# Patient Record
Sex: Female | Born: 1955
Health system: Southern US, Community
[De-identification: ages and names within clinical notes are randomized; demographics above are authoritative.]

## PROBLEM LIST (undated history)

## (undated) DIAGNOSIS — M7731 Calcaneal spur, right foot: Secondary | ICD-10-CM

## (undated) DIAGNOSIS — D649 Anemia, unspecified: Secondary | ICD-10-CM

## (undated) DIAGNOSIS — I10 Essential (primary) hypertension: Secondary | ICD-10-CM

## (undated) DIAGNOSIS — Z860100 Personal history of colon polyps, unspecified: Secondary | ICD-10-CM

## (undated) DIAGNOSIS — M1990 Unspecified osteoarthritis, unspecified site: Secondary | ICD-10-CM

## (undated) DIAGNOSIS — E079 Disorder of thyroid, unspecified: Secondary | ICD-10-CM

## (undated) DIAGNOSIS — R531 Weakness: Secondary | ICD-10-CM

## (undated) DIAGNOSIS — I639 Cerebral infarction, unspecified: Secondary | ICD-10-CM

## (undated) DIAGNOSIS — E785 Hyperlipidemia, unspecified: Secondary | ICD-10-CM

## (undated) DIAGNOSIS — M7732 Calcaneal spur, left foot: Secondary | ICD-10-CM

## (undated) HISTORY — DX: Weakness: R53.1

## (undated) HISTORY — DX: Essential (primary) hypertension: I10

## (undated) HISTORY — DX: Personal history of colonic polyps: Z86.010

## (undated) HISTORY — PX: COLONOSCOPY: SHX174

## (undated) HISTORY — DX: Hyperlipidemia, unspecified: E78.5

## (undated) HISTORY — DX: Disorder of thyroid, unspecified: E07.9

## (undated) HISTORY — DX: Personal history of colon polyps, unspecified: Z86.0100

## (undated) HISTORY — DX: Cerebral infarction, unspecified: I63.9

## (undated) HISTORY — DX: Anemia, unspecified: D64.9

## (undated) HISTORY — DX: Unspecified osteoarthritis, unspecified site: M19.90

## (undated) HISTORY — DX: Calcaneal spur, left foot: M77.31

## (undated) HISTORY — DX: Calcaneal spur, left foot: M77.32

---

## 1985-01-19 HISTORY — PX: HAND SURGERY: SHX662

## 2000-01-21 ENCOUNTER — Encounter: Payer: Self-pay | Admitting: Emergency Medicine

## 2000-01-21 ENCOUNTER — Emergency Department (HOSPITAL_COMMUNITY): Admission: EM | Admit: 2000-01-21 | Discharge: 2000-01-21 | Payer: Self-pay | Admitting: Emergency Medicine

## 2014-01-19 DIAGNOSIS — I639 Cerebral infarction, unspecified: Secondary | ICD-10-CM

## 2014-01-19 HISTORY — DX: Cerebral infarction, unspecified: I63.9

## 2017-03-19 DIAGNOSIS — G5692 Unspecified mononeuropathy of left upper limb: Secondary | ICD-10-CM | POA: Diagnosis not present

## 2017-03-19 DIAGNOSIS — I10 Essential (primary) hypertension: Secondary | ICD-10-CM | POA: Diagnosis not present

## 2017-03-20 DIAGNOSIS — G5692 Unspecified mononeuropathy of left upper limb: Secondary | ICD-10-CM | POA: Diagnosis not present

## 2017-03-20 DIAGNOSIS — I10 Essential (primary) hypertension: Secondary | ICD-10-CM | POA: Diagnosis not present

## 2017-03-21 DIAGNOSIS — G5692 Unspecified mononeuropathy of left upper limb: Secondary | ICD-10-CM | POA: Diagnosis not present

## 2017-03-21 DIAGNOSIS — I10 Essential (primary) hypertension: Secondary | ICD-10-CM | POA: Diagnosis not present

## 2017-03-22 DIAGNOSIS — I10 Essential (primary) hypertension: Secondary | ICD-10-CM | POA: Diagnosis not present

## 2017-03-22 DIAGNOSIS — G5692 Unspecified mononeuropathy of left upper limb: Secondary | ICD-10-CM | POA: Diagnosis not present

## 2017-03-23 DIAGNOSIS — I10 Essential (primary) hypertension: Secondary | ICD-10-CM | POA: Diagnosis not present

## 2017-03-23 DIAGNOSIS — G5692 Unspecified mononeuropathy of left upper limb: Secondary | ICD-10-CM | POA: Diagnosis not present

## 2017-03-24 DIAGNOSIS — I10 Essential (primary) hypertension: Secondary | ICD-10-CM | POA: Diagnosis not present

## 2017-03-24 DIAGNOSIS — G5692 Unspecified mononeuropathy of left upper limb: Secondary | ICD-10-CM | POA: Diagnosis not present

## 2017-03-25 DIAGNOSIS — I10 Essential (primary) hypertension: Secondary | ICD-10-CM | POA: Diagnosis not present

## 2017-03-25 DIAGNOSIS — G5692 Unspecified mononeuropathy of left upper limb: Secondary | ICD-10-CM | POA: Diagnosis not present

## 2017-03-26 DIAGNOSIS — G5692 Unspecified mononeuropathy of left upper limb: Secondary | ICD-10-CM | POA: Diagnosis not present

## 2017-03-26 DIAGNOSIS — I10 Essential (primary) hypertension: Secondary | ICD-10-CM | POA: Diagnosis not present

## 2017-03-27 DIAGNOSIS — I10 Essential (primary) hypertension: Secondary | ICD-10-CM | POA: Diagnosis not present

## 2017-03-27 DIAGNOSIS — G5692 Unspecified mononeuropathy of left upper limb: Secondary | ICD-10-CM | POA: Diagnosis not present

## 2017-03-28 DIAGNOSIS — G5692 Unspecified mononeuropathy of left upper limb: Secondary | ICD-10-CM | POA: Diagnosis not present

## 2017-03-28 DIAGNOSIS — I10 Essential (primary) hypertension: Secondary | ICD-10-CM | POA: Diagnosis not present

## 2017-03-29 DIAGNOSIS — I10 Essential (primary) hypertension: Secondary | ICD-10-CM | POA: Diagnosis not present

## 2017-03-29 DIAGNOSIS — G5692 Unspecified mononeuropathy of left upper limb: Secondary | ICD-10-CM | POA: Diagnosis not present

## 2017-03-30 DIAGNOSIS — I10 Essential (primary) hypertension: Secondary | ICD-10-CM | POA: Diagnosis not present

## 2017-03-30 DIAGNOSIS — G5692 Unspecified mononeuropathy of left upper limb: Secondary | ICD-10-CM | POA: Diagnosis not present

## 2017-03-31 DIAGNOSIS — G5692 Unspecified mononeuropathy of left upper limb: Secondary | ICD-10-CM | POA: Diagnosis not present

## 2017-03-31 DIAGNOSIS — I10 Essential (primary) hypertension: Secondary | ICD-10-CM | POA: Diagnosis not present

## 2017-04-01 DIAGNOSIS — G5692 Unspecified mononeuropathy of left upper limb: Secondary | ICD-10-CM | POA: Diagnosis not present

## 2017-04-01 DIAGNOSIS — I10 Essential (primary) hypertension: Secondary | ICD-10-CM | POA: Diagnosis not present

## 2017-04-02 DIAGNOSIS — I10 Essential (primary) hypertension: Secondary | ICD-10-CM | POA: Diagnosis not present

## 2017-04-02 DIAGNOSIS — G5692 Unspecified mononeuropathy of left upper limb: Secondary | ICD-10-CM | POA: Diagnosis not present

## 2017-04-03 DIAGNOSIS — G5692 Unspecified mononeuropathy of left upper limb: Secondary | ICD-10-CM | POA: Diagnosis not present

## 2017-04-03 DIAGNOSIS — I10 Essential (primary) hypertension: Secondary | ICD-10-CM | POA: Diagnosis not present

## 2017-04-04 DIAGNOSIS — G5692 Unspecified mononeuropathy of left upper limb: Secondary | ICD-10-CM | POA: Diagnosis not present

## 2017-04-04 DIAGNOSIS — I10 Essential (primary) hypertension: Secondary | ICD-10-CM | POA: Diagnosis not present

## 2017-04-05 DIAGNOSIS — G5692 Unspecified mononeuropathy of left upper limb: Secondary | ICD-10-CM | POA: Diagnosis not present

## 2017-04-05 DIAGNOSIS — I10 Essential (primary) hypertension: Secondary | ICD-10-CM | POA: Diagnosis not present

## 2017-04-06 DIAGNOSIS — I10 Essential (primary) hypertension: Secondary | ICD-10-CM | POA: Diagnosis not present

## 2017-04-06 DIAGNOSIS — G5692 Unspecified mononeuropathy of left upper limb: Secondary | ICD-10-CM | POA: Diagnosis not present

## 2017-04-07 DIAGNOSIS — G5692 Unspecified mononeuropathy of left upper limb: Secondary | ICD-10-CM | POA: Diagnosis not present

## 2017-04-07 DIAGNOSIS — I10 Essential (primary) hypertension: Secondary | ICD-10-CM | POA: Diagnosis not present

## 2017-04-08 DIAGNOSIS — I10 Essential (primary) hypertension: Secondary | ICD-10-CM | POA: Diagnosis not present

## 2017-04-08 DIAGNOSIS — G5692 Unspecified mononeuropathy of left upper limb: Secondary | ICD-10-CM | POA: Diagnosis not present

## 2017-04-09 DIAGNOSIS — G5692 Unspecified mononeuropathy of left upper limb: Secondary | ICD-10-CM | POA: Diagnosis not present

## 2017-04-09 DIAGNOSIS — I10 Essential (primary) hypertension: Secondary | ICD-10-CM | POA: Diagnosis not present

## 2017-04-10 DIAGNOSIS — I10 Essential (primary) hypertension: Secondary | ICD-10-CM | POA: Diagnosis not present

## 2017-04-10 DIAGNOSIS — G5692 Unspecified mononeuropathy of left upper limb: Secondary | ICD-10-CM | POA: Diagnosis not present

## 2017-04-11 DIAGNOSIS — I10 Essential (primary) hypertension: Secondary | ICD-10-CM | POA: Diagnosis not present

## 2017-04-11 DIAGNOSIS — G5692 Unspecified mononeuropathy of left upper limb: Secondary | ICD-10-CM | POA: Diagnosis not present

## 2017-04-12 DIAGNOSIS — G5692 Unspecified mononeuropathy of left upper limb: Secondary | ICD-10-CM | POA: Diagnosis not present

## 2017-04-12 DIAGNOSIS — I10 Essential (primary) hypertension: Secondary | ICD-10-CM | POA: Diagnosis not present

## 2017-04-13 DIAGNOSIS — G5692 Unspecified mononeuropathy of left upper limb: Secondary | ICD-10-CM | POA: Diagnosis not present

## 2017-04-13 DIAGNOSIS — I10 Essential (primary) hypertension: Secondary | ICD-10-CM | POA: Diagnosis not present

## 2017-04-14 DIAGNOSIS — I10 Essential (primary) hypertension: Secondary | ICD-10-CM | POA: Diagnosis not present

## 2017-04-14 DIAGNOSIS — G5692 Unspecified mononeuropathy of left upper limb: Secondary | ICD-10-CM | POA: Diagnosis not present

## 2017-04-15 DIAGNOSIS — I10 Essential (primary) hypertension: Secondary | ICD-10-CM | POA: Diagnosis not present

## 2017-04-15 DIAGNOSIS — G5692 Unspecified mononeuropathy of left upper limb: Secondary | ICD-10-CM | POA: Diagnosis not present

## 2017-04-16 DIAGNOSIS — G5692 Unspecified mononeuropathy of left upper limb: Secondary | ICD-10-CM | POA: Diagnosis not present

## 2017-04-16 DIAGNOSIS — I10 Essential (primary) hypertension: Secondary | ICD-10-CM | POA: Diagnosis not present

## 2017-04-17 DIAGNOSIS — I10 Essential (primary) hypertension: Secondary | ICD-10-CM | POA: Diagnosis not present

## 2017-04-17 DIAGNOSIS — G5692 Unspecified mononeuropathy of left upper limb: Secondary | ICD-10-CM | POA: Diagnosis not present

## 2017-04-18 DIAGNOSIS — I10 Essential (primary) hypertension: Secondary | ICD-10-CM | POA: Diagnosis not present

## 2017-04-18 DIAGNOSIS — G5692 Unspecified mononeuropathy of left upper limb: Secondary | ICD-10-CM | POA: Diagnosis not present

## 2017-04-19 DIAGNOSIS — G5692 Unspecified mononeuropathy of left upper limb: Secondary | ICD-10-CM | POA: Diagnosis not present

## 2017-04-19 DIAGNOSIS — I10 Essential (primary) hypertension: Secondary | ICD-10-CM | POA: Diagnosis not present

## 2017-04-20 DIAGNOSIS — G5692 Unspecified mononeuropathy of left upper limb: Secondary | ICD-10-CM | POA: Diagnosis not present

## 2017-04-20 DIAGNOSIS — I10 Essential (primary) hypertension: Secondary | ICD-10-CM | POA: Diagnosis not present

## 2017-04-21 DIAGNOSIS — I10 Essential (primary) hypertension: Secondary | ICD-10-CM | POA: Diagnosis not present

## 2017-04-21 DIAGNOSIS — G5692 Unspecified mononeuropathy of left upper limb: Secondary | ICD-10-CM | POA: Diagnosis not present

## 2017-04-22 DIAGNOSIS — I10 Essential (primary) hypertension: Secondary | ICD-10-CM | POA: Diagnosis not present

## 2017-04-22 DIAGNOSIS — G5692 Unspecified mononeuropathy of left upper limb: Secondary | ICD-10-CM | POA: Diagnosis not present

## 2017-04-23 DIAGNOSIS — R531 Weakness: Secondary | ICD-10-CM | POA: Diagnosis not present

## 2017-04-24 DIAGNOSIS — R531 Weakness: Secondary | ICD-10-CM | POA: Diagnosis not present

## 2017-04-25 DIAGNOSIS — R531 Weakness: Secondary | ICD-10-CM | POA: Diagnosis not present

## 2017-04-26 DIAGNOSIS — R531 Weakness: Secondary | ICD-10-CM | POA: Diagnosis not present

## 2017-04-27 DIAGNOSIS — R531 Weakness: Secondary | ICD-10-CM | POA: Diagnosis not present

## 2017-04-28 DIAGNOSIS — R531 Weakness: Secondary | ICD-10-CM | POA: Diagnosis not present

## 2017-04-29 DIAGNOSIS — R531 Weakness: Secondary | ICD-10-CM | POA: Diagnosis not present

## 2017-04-30 DIAGNOSIS — R531 Weakness: Secondary | ICD-10-CM | POA: Diagnosis not present

## 2017-05-01 DIAGNOSIS — R531 Weakness: Secondary | ICD-10-CM | POA: Diagnosis not present

## 2017-05-02 DIAGNOSIS — R531 Weakness: Secondary | ICD-10-CM | POA: Diagnosis not present

## 2017-05-03 DIAGNOSIS — R531 Weakness: Secondary | ICD-10-CM | POA: Diagnosis not present

## 2017-05-04 DIAGNOSIS — R531 Weakness: Secondary | ICD-10-CM | POA: Diagnosis not present

## 2017-05-05 DIAGNOSIS — Z0000 Encounter for general adult medical examination without abnormal findings: Secondary | ICD-10-CM | POA: Diagnosis not present

## 2017-05-05 DIAGNOSIS — R531 Weakness: Secondary | ICD-10-CM | POA: Diagnosis not present

## 2017-05-05 DIAGNOSIS — Z01419 Encounter for gynecological examination (general) (routine) without abnormal findings: Secondary | ICD-10-CM | POA: Diagnosis not present

## 2017-05-06 DIAGNOSIS — R531 Weakness: Secondary | ICD-10-CM | POA: Diagnosis not present

## 2017-05-07 DIAGNOSIS — R531 Weakness: Secondary | ICD-10-CM | POA: Diagnosis not present

## 2017-05-08 DIAGNOSIS — R531 Weakness: Secondary | ICD-10-CM | POA: Diagnosis not present

## 2017-05-09 DIAGNOSIS — R531 Weakness: Secondary | ICD-10-CM | POA: Diagnosis not present

## 2017-05-10 DIAGNOSIS — R531 Weakness: Secondary | ICD-10-CM | POA: Diagnosis not present

## 2017-05-11 DIAGNOSIS — R531 Weakness: Secondary | ICD-10-CM | POA: Diagnosis not present

## 2017-05-12 DIAGNOSIS — R531 Weakness: Secondary | ICD-10-CM | POA: Diagnosis not present

## 2017-05-13 DIAGNOSIS — R531 Weakness: Secondary | ICD-10-CM | POA: Diagnosis not present

## 2017-05-14 DIAGNOSIS — R531 Weakness: Secondary | ICD-10-CM | POA: Diagnosis not present

## 2017-05-15 DIAGNOSIS — R531 Weakness: Secondary | ICD-10-CM | POA: Diagnosis not present

## 2017-05-16 DIAGNOSIS — R531 Weakness: Secondary | ICD-10-CM | POA: Diagnosis not present

## 2017-05-17 DIAGNOSIS — R531 Weakness: Secondary | ICD-10-CM | POA: Diagnosis not present

## 2017-05-18 DIAGNOSIS — R531 Weakness: Secondary | ICD-10-CM | POA: Diagnosis not present

## 2017-05-19 DIAGNOSIS — R531 Weakness: Secondary | ICD-10-CM | POA: Diagnosis not present

## 2017-05-20 DIAGNOSIS — R531 Weakness: Secondary | ICD-10-CM | POA: Diagnosis not present

## 2017-05-21 DIAGNOSIS — R531 Weakness: Secondary | ICD-10-CM | POA: Diagnosis not present

## 2017-05-22 DIAGNOSIS — R531 Weakness: Secondary | ICD-10-CM | POA: Diagnosis not present

## 2017-05-23 DIAGNOSIS — R531 Weakness: Secondary | ICD-10-CM | POA: Diagnosis not present

## 2017-05-24 DIAGNOSIS — R531 Weakness: Secondary | ICD-10-CM | POA: Diagnosis not present

## 2017-05-25 DIAGNOSIS — R531 Weakness: Secondary | ICD-10-CM | POA: Diagnosis not present

## 2017-05-26 DIAGNOSIS — R531 Weakness: Secondary | ICD-10-CM | POA: Diagnosis not present

## 2017-05-27 DIAGNOSIS — R531 Weakness: Secondary | ICD-10-CM | POA: Diagnosis not present

## 2017-05-28 DIAGNOSIS — R531 Weakness: Secondary | ICD-10-CM | POA: Diagnosis not present

## 2017-05-29 DIAGNOSIS — R531 Weakness: Secondary | ICD-10-CM | POA: Diagnosis not present

## 2017-05-30 DIAGNOSIS — R531 Weakness: Secondary | ICD-10-CM | POA: Diagnosis not present

## 2017-05-31 DIAGNOSIS — R531 Weakness: Secondary | ICD-10-CM | POA: Diagnosis not present

## 2017-06-01 DIAGNOSIS — R531 Weakness: Secondary | ICD-10-CM | POA: Diagnosis not present

## 2017-06-01 DIAGNOSIS — Z1231 Encounter for screening mammogram for malignant neoplasm of breast: Secondary | ICD-10-CM | POA: Diagnosis not present

## 2017-06-02 DIAGNOSIS — R531 Weakness: Secondary | ICD-10-CM | POA: Diagnosis not present

## 2017-06-03 DIAGNOSIS — R531 Weakness: Secondary | ICD-10-CM | POA: Diagnosis not present

## 2017-06-04 DIAGNOSIS — R531 Weakness: Secondary | ICD-10-CM | POA: Diagnosis not present

## 2017-06-05 DIAGNOSIS — R531 Weakness: Secondary | ICD-10-CM | POA: Diagnosis not present

## 2017-06-06 DIAGNOSIS — R531 Weakness: Secondary | ICD-10-CM | POA: Diagnosis not present

## 2017-06-07 DIAGNOSIS — R531 Weakness: Secondary | ICD-10-CM | POA: Diagnosis not present

## 2017-06-08 DIAGNOSIS — R531 Weakness: Secondary | ICD-10-CM | POA: Diagnosis not present

## 2017-06-09 DIAGNOSIS — R531 Weakness: Secondary | ICD-10-CM | POA: Diagnosis not present

## 2017-06-10 DIAGNOSIS — R531 Weakness: Secondary | ICD-10-CM | POA: Diagnosis not present

## 2017-06-11 DIAGNOSIS — R531 Weakness: Secondary | ICD-10-CM | POA: Diagnosis not present

## 2017-06-12 DIAGNOSIS — R531 Weakness: Secondary | ICD-10-CM | POA: Diagnosis not present

## 2017-06-13 DIAGNOSIS — R531 Weakness: Secondary | ICD-10-CM | POA: Diagnosis not present

## 2017-06-14 DIAGNOSIS — R531 Weakness: Secondary | ICD-10-CM | POA: Diagnosis not present

## 2017-06-15 DIAGNOSIS — R531 Weakness: Secondary | ICD-10-CM | POA: Diagnosis not present

## 2017-06-16 DIAGNOSIS — R531 Weakness: Secondary | ICD-10-CM | POA: Diagnosis not present

## 2017-06-17 DIAGNOSIS — R531 Weakness: Secondary | ICD-10-CM | POA: Diagnosis not present

## 2017-06-18 DIAGNOSIS — R531 Weakness: Secondary | ICD-10-CM | POA: Diagnosis not present

## 2017-06-19 DIAGNOSIS — R531 Weakness: Secondary | ICD-10-CM | POA: Diagnosis not present

## 2017-06-20 DIAGNOSIS — R531 Weakness: Secondary | ICD-10-CM | POA: Diagnosis not present

## 2017-06-21 DIAGNOSIS — R531 Weakness: Secondary | ICD-10-CM | POA: Diagnosis not present

## 2017-06-22 DIAGNOSIS — R531 Weakness: Secondary | ICD-10-CM | POA: Diagnosis not present

## 2017-06-23 DIAGNOSIS — R531 Weakness: Secondary | ICD-10-CM | POA: Diagnosis not present

## 2017-06-24 DIAGNOSIS — R531 Weakness: Secondary | ICD-10-CM | POA: Diagnosis not present

## 2017-06-25 DIAGNOSIS — R531 Weakness: Secondary | ICD-10-CM | POA: Diagnosis not present

## 2017-06-26 DIAGNOSIS — R531 Weakness: Secondary | ICD-10-CM | POA: Diagnosis not present

## 2017-06-27 DIAGNOSIS — R531 Weakness: Secondary | ICD-10-CM | POA: Diagnosis not present

## 2017-06-28 DIAGNOSIS — R531 Weakness: Secondary | ICD-10-CM | POA: Diagnosis not present

## 2017-06-29 DIAGNOSIS — R531 Weakness: Secondary | ICD-10-CM | POA: Diagnosis not present

## 2017-06-30 DIAGNOSIS — R531 Weakness: Secondary | ICD-10-CM | POA: Diagnosis not present

## 2017-07-01 DIAGNOSIS — R531 Weakness: Secondary | ICD-10-CM | POA: Diagnosis not present

## 2017-07-02 DIAGNOSIS — R531 Weakness: Secondary | ICD-10-CM | POA: Diagnosis not present

## 2017-07-03 DIAGNOSIS — R531 Weakness: Secondary | ICD-10-CM | POA: Diagnosis not present

## 2017-07-04 DIAGNOSIS — R531 Weakness: Secondary | ICD-10-CM | POA: Diagnosis not present

## 2017-07-05 DIAGNOSIS — R531 Weakness: Secondary | ICD-10-CM | POA: Diagnosis not present

## 2017-07-06 DIAGNOSIS — R531 Weakness: Secondary | ICD-10-CM | POA: Diagnosis not present

## 2017-07-07 DIAGNOSIS — R531 Weakness: Secondary | ICD-10-CM | POA: Diagnosis not present

## 2017-07-08 DIAGNOSIS — R531 Weakness: Secondary | ICD-10-CM | POA: Diagnosis not present

## 2017-07-09 DIAGNOSIS — R531 Weakness: Secondary | ICD-10-CM | POA: Diagnosis not present

## 2017-07-10 DIAGNOSIS — R531 Weakness: Secondary | ICD-10-CM | POA: Diagnosis not present

## 2017-07-11 DIAGNOSIS — R531 Weakness: Secondary | ICD-10-CM | POA: Diagnosis not present

## 2017-07-12 DIAGNOSIS — R531 Weakness: Secondary | ICD-10-CM | POA: Diagnosis not present

## 2017-07-13 DIAGNOSIS — R531 Weakness: Secondary | ICD-10-CM | POA: Diagnosis not present

## 2017-07-14 DIAGNOSIS — R531 Weakness: Secondary | ICD-10-CM | POA: Diagnosis not present

## 2017-07-15 DIAGNOSIS — R531 Weakness: Secondary | ICD-10-CM | POA: Diagnosis not present

## 2017-07-16 DIAGNOSIS — R531 Weakness: Secondary | ICD-10-CM | POA: Diagnosis not present

## 2017-07-17 DIAGNOSIS — R531 Weakness: Secondary | ICD-10-CM | POA: Diagnosis not present

## 2017-07-18 DIAGNOSIS — R531 Weakness: Secondary | ICD-10-CM | POA: Diagnosis not present

## 2017-08-11 ENCOUNTER — Other Ambulatory Visit (INDEPENDENT_AMBULATORY_CARE_PROVIDER_SITE_OTHER): Payer: Medicare HMO

## 2017-08-11 ENCOUNTER — Ambulatory Visit: Payer: Self-pay | Admitting: Family

## 2017-08-11 ENCOUNTER — Encounter: Payer: Self-pay | Admitting: Gastroenterology

## 2017-08-11 ENCOUNTER — Ambulatory Visit (INDEPENDENT_AMBULATORY_CARE_PROVIDER_SITE_OTHER): Payer: Medicare HMO | Admitting: Family

## 2017-08-11 ENCOUNTER — Encounter: Payer: Self-pay | Admitting: Family

## 2017-08-11 VITALS — BP 122/82 | HR 64 | Temp 98.0°F | Ht 67.0 in | Wt 159.1 lb

## 2017-08-11 DIAGNOSIS — F32A Depression, unspecified: Secondary | ICD-10-CM

## 2017-08-11 DIAGNOSIS — Z1211 Encounter for screening for malignant neoplasm of colon: Secondary | ICD-10-CM | POA: Diagnosis not present

## 2017-08-11 DIAGNOSIS — M1990 Unspecified osteoarthritis, unspecified site: Secondary | ICD-10-CM | POA: Insufficient documentation

## 2017-08-11 DIAGNOSIS — Z720 Tobacco use: Secondary | ICD-10-CM | POA: Diagnosis not present

## 2017-08-11 DIAGNOSIS — Z1159 Encounter for screening for other viral diseases: Secondary | ICD-10-CM

## 2017-08-11 DIAGNOSIS — E039 Hypothyroidism, unspecified: Secondary | ICD-10-CM

## 2017-08-11 DIAGNOSIS — I639 Cerebral infarction, unspecified: Secondary | ICD-10-CM

## 2017-08-11 DIAGNOSIS — E559 Vitamin D deficiency, unspecified: Secondary | ICD-10-CM

## 2017-08-11 DIAGNOSIS — F329 Major depressive disorder, single episode, unspecified: Secondary | ICD-10-CM | POA: Insufficient documentation

## 2017-08-11 DIAGNOSIS — R7303 Prediabetes: Secondary | ICD-10-CM | POA: Diagnosis not present

## 2017-08-11 DIAGNOSIS — M79672 Pain in left foot: Secondary | ICD-10-CM

## 2017-08-11 DIAGNOSIS — M79671 Pain in right foot: Secondary | ICD-10-CM

## 2017-08-11 DIAGNOSIS — I10 Essential (primary) hypertension: Secondary | ICD-10-CM | POA: Diagnosis not present

## 2017-08-11 DIAGNOSIS — B019 Varicella without complication: Secondary | ICD-10-CM | POA: Insufficient documentation

## 2017-08-11 LAB — CBC WITH DIFFERENTIAL/PLATELET
Basophils Absolute: 0.1 10*3/uL (ref 0.0–0.1)
Basophils Relative: 1.2 % (ref 0.0–3.0)
Eosinophils Absolute: 0.1 10*3/uL (ref 0.0–0.7)
Eosinophils Relative: 0.8 % (ref 0.0–5.0)
HCT: 40.8 % (ref 36.0–46.0)
Hemoglobin: 13.6 g/dL (ref 12.0–15.0)
LYMPHS ABS: 3.4 10*3/uL (ref 0.7–4.0)
Lymphocytes Relative: 54 % — ABNORMAL HIGH (ref 12.0–46.0)
MCHC: 33.4 g/dL (ref 30.0–36.0)
MCV: 87.3 fl (ref 78.0–100.0)
Monocytes Absolute: 0.5 10*3/uL (ref 0.1–1.0)
Monocytes Relative: 7.6 % (ref 3.0–12.0)
Neutro Abs: 2.3 10*3/uL (ref 1.4–7.7)
Neutrophils Relative %: 36.4 % — ABNORMAL LOW (ref 43.0–77.0)
Platelets: 282 10*3/uL (ref 150.0–400.0)
RBC: 4.67 Mil/uL (ref 3.87–5.11)
RDW: 15.3 % (ref 11.5–15.5)
WBC: 6.4 10*3/uL (ref 4.0–10.5)

## 2017-08-11 LAB — COMPREHENSIVE METABOLIC PANEL
ALK PHOS: 100 U/L (ref 39–117)
ALT: 23 U/L (ref 0–35)
AST: 24 U/L (ref 0–37)
Albumin: 4.2 g/dL (ref 3.5–5.2)
BUN: 11 mg/dL (ref 6–23)
CO2: 27 mEq/L (ref 19–32)
Calcium: 9.2 mg/dL (ref 8.4–10.5)
Chloride: 100 mEq/L (ref 96–112)
Creatinine, Ser: 0.82 mg/dL (ref 0.40–1.20)
GFR: 91.42 mL/min (ref >60.00)
Glucose, Bld: 94 mg/dL (ref 70–99)
Potassium: 4.5 mEq/L (ref 3.5–5.1)
SODIUM: 135 meq/L (ref 135–145)
TOTAL PROTEIN: 7.7 g/dL (ref 6.0–8.3)
Total Bilirubin: 0.3 mg/dL (ref 0.2–1.2)

## 2017-08-11 LAB — LIPID PANEL
CHOL/HDL RATIO: 3
Cholesterol: 163 mg/dL (ref 0–200)
HDL: 55.5 mg/dL (ref >39.00)
LDL Cholesterol: 81 mg/dL (ref 0–99)
NonHDL: 107.83
Triglycerides: 136 mg/dL (ref 0.0–149.0)
VLDL: 27.2 mg/dL (ref 0.0–40.0)

## 2017-08-11 LAB — TSH: TSH: 2.05 u[IU]/mL (ref 0.35–4.50)

## 2017-08-11 LAB — HEMOGLOBIN A1C: HEMOGLOBIN A1C: 6.4 % (ref 4.6–6.5)

## 2017-08-11 LAB — VITAMIN D 25 HYDROXY (VIT D DEFICIENCY, FRACTURES): VITD: 29.8 ng/mL — ABNORMAL LOW (ref 30.00–100.00)

## 2017-08-11 MED ORDER — METOPROLOL TARTRATE 25 MG PO TABS
25.0000 mg | ORAL_TABLET | Freq: Two times a day (BID) | ORAL | 1 refills | Status: DC
Start: 2017-08-11 — End: 2020-02-19

## 2017-08-11 MED ORDER — GABAPENTIN 600 MG PO TABS: 600.0000 mg | ORAL_TABLET | Freq: Three times a day (TID) | ORAL | 0 refills | Status: DC

## 2017-08-11 NOTE — Progress Notes (Signed)
Jordan Bass is a 62 y.o. female with the following history as recorded in Epic[facility]:  Patient Active Problem List   Diagnosis Date Noted  . Arthritis 08/11/2017  . Depression 08/11/2017  . High blood pressure 08/11/2017  . Stroke (cerebrum) ([place]) 08/11/2017  . Chicken pox 08/11/2017    Current Outpatient Medications  Medication Sig Dispense Refill  . amLODipine (NORVASC) 5 MG tablet     . aspirin EC 81 MG tablet Take 81 mg by mouth daily.    . Cholecalciferol (VITAMIN D3) 1000 units CAPS Take by mouth.    . famotidine (PEPCID) 20 MG tablet     . levothyroxine (SYNTHROID, LEVOTHROID) 25 MCG tablet     . lisinopril (PRINIVIL,ZESTRIL) 10 MG tablet     . metoprolol tartrate (LOPRESSOR) 25 MG tablet Take 1 tablet (25 mg total) by mouth 2 (two) times daily. 180 tablet 1  . rosuvastatin (CRESTOR) 5 MG tablet     . spironolactone (ALDACTONE) 25 MG tablet     . gabapentin (NEURONTIN) 600 MG tablet Take 1 tablet (600 mg total) by mouth 3 (three) times daily. 270 tablet 0   No current facility-administered medications for this visit.     Allergies: Patient has no allergy information on record.  History reviewed. No pertinent past medical history.  History reviewed. No pertinent surgical history.  History reviewed. No pertinent family history.  Social History   Tobacco Use  . Smoking status: Not on file  Substance Use Topics  . Alcohol use: Not on file    Subjective:  Patient presents to establish care; has moved back to [place] in the past 6 months; was living in [place], [place]; history of hypertension, hypothyroidism; had CVA in 2016; + smoker- knows needs to quit but not ready at this time; Denies any chest pain, shortness of breath, blurred vision or headache.  Is up to date on mammogram/ pap smear- will get records transferred; Notes that vaccines up to date as well- will get records transferred;   Objective:  Vitals:   08/11/17 0939 08/11/17 0941  BP: 122/82   Pulse: 64    Temp: 98 F (36.7 C)   TempSrc: Oral   SpO2: 99%   Weight: 159 lb 1.9 oz (72.2 kg)   Height:  5' 7"  (1.702 m)    General: Well developed, well nourished, in no acute distress  Skin : Warm and dry.  Head: Normocephalic and atraumatic  Eyes: Sclera and conjunctiva clear; pupils round and reactive to light; extraocular movements intact  Ears: External normal; canals clear; tympanic membranes normal  Oropharynx: Pink, supple. No suspicious lesions  Neck: Supple without thyromegaly, adenopathy  Lungs: Respirations unlabored; clear to auscultation bilaterally without wheeze, rales, rhonchi  CVS exam: normal rate and regular rhythm.  Musculoskeletal: No deformities; no active joint inflammation  Extremities: No edema, cyanosis, clubbing  Vessels: Symmetric bilaterally  Neurologic: Alert and oriented; speech intact; face symmetrical; uses cane for stability; CNII-XII intact without focal deficit  Assessment:  1. Hypertension, unspecified type   2. Arthritis   3. Depression, unspecified depression type   4. Cerebrovascular accident (CVA), unspecified mechanism ([place])   5. Encounter for screening colonoscopy   6. Pre-diabetes   7. Hypothyroidism, unspecified type   8. Vitamin D deficiency   9. Tobacco abuse   10. Pain in both feet   11. Need for hepatitis C screening test     Plan:  Chronic conditions are stable; refills are up to date- extensive medication review  done with patient today; will update labs for chronic needs- she thinks labs were done by her previous provider about 6 months ago; refer for colonoscopy/ lung cancer CT screen; encouraged to quit smoking; records request has been sent; will update vaccines as needed after records received; refer to podiatry per patient request; follow-up to be determined.    No follow-ups on file.  Orders Placed This Encounter  Procedures  . CBC w/Diff    Standing Status:   Future    Number of Occurrences:   1    Standing Expiration Date:    08/11/2018  . Comp Met (CMET)    Standing Status:   Future    Number of Occurrences:   1    Standing Expiration Date:   08/11/2018  . Lipid panel    Standing Status:   Future    Number of Occurrences:   1    Standing Expiration Date:   08/12/2018  . HgB A1c    Standing Status:   Future    Number of Occurrences:   1    Standing Expiration Date:   08/11/2018  . TSH    Standing Status:   Future    Number of Occurrences:   1    Standing Expiration Date:   08/11/2018  . Vitamin D (25 hydroxy)    Standing Status:   Future    Number of Occurrences:   1    Standing Expiration Date:   08/11/2018  . Hepatitis C Antibody    Standing Status:   Future    Number of Occurrences:   1    Standing Expiration Date:   08/11/2018  . Ambulatory referral to Gastroenterology    Referral Priority:   Routine    Referral Type:   Consultation    Referral Reason:   Specialty Services Required    Number of Visits Requested:   1  . Ambulatory Referral for Lung Cancer Scre    Referral Priority:   Routine    Referral Type:   Consultation    Referral Reason:   Specialty Services Required    Number of Visits Requested:   1  . Ambulatory referral to Podiatry    Referral Priority:   Routine    Referral Type:   Consultation    Referral Reason:   Specialty Services Required    Requested Specialty:   Podiatry    Number of Visits Requested:   1    Requested Prescriptions   Signed Prescriptions Disp Refills  . metoprolol tartrate (LOPRESSOR) 25 MG tablet 180 tablet 1    Sig: Take 1 tablet (25 mg total) by mouth 2 (two) times daily.  Marland Kitchen gabapentin (NEURONTIN) 600 MG tablet 270 tablet 0    Sig: Take 1 tablet (600 mg total) by mouth 3 (three) times daily.

## 2017-08-12 LAB — HEPATITIS C ANTIBODY
HEP C AB: NONREACTIVE
SIGNAL TO CUT-OFF: 0.02 (ref <1.00)

## 2017-08-13 ENCOUNTER — Telehealth: Payer: Self-pay

## 2017-08-13 NOTE — Telephone Encounter (Signed)
Send request for pap and mammogram info. Patient filled out release for [place] to obtain records.

## 2017-08-13 NOTE — Telephone Encounter (Signed)
Faxed request for medical records to [facility] [phone] 08/13/17  LM

## 2017-08-17 NOTE — Telephone Encounter (Signed)
Received Medical records from [facility] - sending via interoffice mail to [employer]  08/17/17  LM

## 2017-08-17 NOTE — Telephone Encounter (Signed)
Request for medical records has already been sent for [place] to receive info.

## 2017-08-17 NOTE — Telephone Encounter (Signed)
Jordan Bass       08/13/17 4:48 PM  Note    Faxed request for medical records to [facility] [phone] 08/13/17  LM

## 2017-08-24 ENCOUNTER — Ambulatory Visit: Payer: Medicare HMO | Admitting: Sports Medicine

## 2017-09-13 DIAGNOSIS — R531 Weakness: Secondary | ICD-10-CM | POA: Diagnosis not present

## 2017-09-14 ENCOUNTER — Ambulatory Visit (INDEPENDENT_AMBULATORY_CARE_PROVIDER_SITE_OTHER): Payer: Medicare HMO | Admitting: Sports Medicine

## 2017-09-14 ENCOUNTER — Telehealth: Payer: Self-pay | Admitting: *Deleted

## 2017-09-14 ENCOUNTER — Ambulatory Visit (INDEPENDENT_AMBULATORY_CARE_PROVIDER_SITE_OTHER): Payer: Medicare HMO

## 2017-09-14 ENCOUNTER — Encounter: Payer: Self-pay | Admitting: Sports Medicine

## 2017-09-14 VITALS — BP 110/61 | HR 62

## 2017-09-14 DIAGNOSIS — R29898 Other symptoms and signs involving the musculoskeletal system: Secondary | ICD-10-CM

## 2017-09-14 DIAGNOSIS — M79672 Pain in left foot: Secondary | ICD-10-CM | POA: Diagnosis not present

## 2017-09-14 DIAGNOSIS — Z8673 Personal history of transient ischemic attack (TIA), and cerebral infarction without residual deficits: Secondary | ICD-10-CM

## 2017-09-14 DIAGNOSIS — M722 Plantar fascial fibromatosis: Secondary | ICD-10-CM

## 2017-09-14 DIAGNOSIS — M7662 Achilles tendinitis, left leg: Secondary | ICD-10-CM | POA: Diagnosis not present

## 2017-09-14 DIAGNOSIS — M7661 Achilles tendinitis, right leg: Secondary | ICD-10-CM | POA: Diagnosis not present

## 2017-09-14 DIAGNOSIS — M79671 Pain in right foot: Secondary | ICD-10-CM | POA: Diagnosis not present

## 2017-09-14 DIAGNOSIS — R531 Weakness: Secondary | ICD-10-CM | POA: Diagnosis not present

## 2017-09-14 MED ORDER — METHYLPREDNISOLONE 4 MG PO TBPK: ORAL_TABLET | ORAL | 0 refills | Status: DC

## 2017-09-14 NOTE — Telephone Encounter (Signed)
-----   Message from Landis Martins, [place] sent at 09/14/2017 10:46 AM EDT ----- Regarding: PT [facility] in office  Tendonitis and fasciitis history of stroke

## 2017-09-14 NOTE — Progress Notes (Signed)
Subjective: Jordan Bass is a 62 y.o. female patient who presents to office for evaluation of Left greater than right heel pain. Patient complains of progressive pain especially over the last 20 years.  Patient reports that she had a history of a stroke and left side is also her weak side and states that she has had issues with her heels bothering her ever since.  Patient has tried injections in the past with no relief in symptoms. Patient denies any other pedal complaints.   Review of Systems  Musculoskeletal: Positive for joint pain.  All other systems reviewed and are negative.    Patient Active Problem List   Diagnosis Date Noted  . Arthritis 08/11/2017  . Depression 08/11/2017  . High blood pressure 08/11/2017  . Stroke (cerebrum) ([place]) 08/11/2017  . Chicken pox 08/11/2017    Current Outpatient Medications on File Prior to Visit  Medication Sig Dispense Refill  . amLODipine (NORVASC) 5 MG tablet     . aspirin EC 81 MG tablet Take 81 mg by mouth daily.    . Cholecalciferol (VITAMIN D3) 1000 units CAPS Take by mouth.    . famotidine (PEPCID) 20 MG tablet     . gabapentin (NEURONTIN) 600 MG tablet Take 1 tablet (600 mg total) by mouth 3 (three) times daily. 270 tablet 0  . levothyroxine (SYNTHROID, LEVOTHROID) 25 MCG tablet     . lisinopril (PRINIVIL,ZESTRIL) 10 MG tablet     . metoprolol tartrate (LOPRESSOR) 25 MG tablet Take 1 tablet (25 mg total) by mouth 2 (two) times daily. 180 tablet 1  . rosuvastatin (CRESTOR) 5 MG tablet     . spironolactone (ALDACTONE) 25 MG tablet      No current facility-administered medications on file prior to visit.     Allergies  Allergen Reactions  . Seasonal Ic [Cholestatin]     Objective:  General: Alert and oriented x3 in no acute distress  Dermatology: No open lesions bilateral lower extremities, no webspace macerations, no ecchymosis bilateral, all nails x 10 are well manicured.  Vascular: Dorsalis Pedis and Posterior Tibial  pedal pulses 1/4, Capillary Fill Time 3 seconds, + pedal hair growth bilateral, no edema bilateral lower extremities, Temperature gradient within normal limits.  Neurology: Johney Maine sensation intact via light touch bilateral.  Musculoskeletal: Mild tenderness with palpation at insertion of the Achilles on left greater than right, there is calcaneal exostosis with mild soft tissue present and decreased ankle rom with knee extending  vs flexed resembling gastroc equnius bilateral, The achilles tendon feels intact with no nodularity or palpable dell, Thompson sign negative, Subtalar and midtarsal joint range of motion is within normal limits, there is no 1st ray hypermobility or forefoot deformity noted bilateral.  Mild pain to plantar fascial insertion bilateral.  Pes planus foot type.  Gait: Antalgic gait with cane.  Xrays  Right/Left Foot    Impression: Normal osseous mineralization. Joint spaces preserved except midtarsal joint supportive of pes planus. No fracture/dislocation/boney destruction. Calcaneal spur present. Kager's triangle intact with no obliteration. No soft tissue abnormalities or radiopaque foreign bodies.   Assessment and Plan: Problem List Items Addressed This Visit    None    Visit Diagnoses    Plantar fasciitis, bilateral    -  Primary   Relevant Orders   DG Foot Complete Right (Completed)   DG Foot Complete Left (Completed)   Achilles tendonitis, bilateral       Heel pain, bilateral       History of  embolic stroke       Left leg weakness          -Complete examination performed -Xrays reviewed -Discussed treatement options for Achilles tendinitis and plantar fasciitis -Rx Medrol to take as instructed and physical therapy -Patient to return to office 4 to 6 weeks follow-up physical therapy or sooner if condition worsens.  Landis Martins, DPM

## 2017-09-14 NOTE — Telephone Encounter (Signed)
Hand delivered required form to [facility] - In-office.

## 2017-09-14 NOTE — Patient Instructions (Signed)
Plantar Fasciitis Rehab Ask your health care provider which exercises are safe for you. Do exercises exactly as told by your health care provider and adjust them as directed. It is normal to feel mild stretching, pulling, tightness, or discomfort as you do these exercises, but you should stop right away if you feel sudden pain or your pain gets worse. Do not begin these exercises until told by your health care provider. Stretching and range of motion exercises These exercises warm up your muscles and joints and improve the movement and flexibility of your foot. These exercises also help to relieve pain. Exercise A: Plantar fascia stretch  1. Sit with your left / right leg crossed over your opposite knee. 2. Hold your heel with one hand with that thumb near your arch. With your other hand, hold your toes and gently pull them back toward the top of your foot. You should feel a stretch on the bottom of your toes or your foot or both. 3. Hold this stretch for__________ seconds. 4. Slowly release your toes and return to the starting position. Repeat __________ times. Complete this exercise __________ times a day. Exercise B: Gastroc, standing  1. Stand with your hands against a wall. 2. Extend your left / right leg behind you, and bend your front knee slightly. 3. Keeping your heels on the floor and keeping your back knee straight, shift your weight toward the wall without arching your back. You should feel a gentle stretch in your left / right calf. 4. Hold this position for __________ seconds. Repeat __________ times. Complete this exercise __________ times a day. Exercise C: Soleus, standing 1. Stand with your hands against a wall. 2. Extend your left / right leg behind you, and bend your front knee slightly. 3. Keeping your heels on the floor, bend your back knee and slightly shift your weight over the back leg. You should feel a gentle stretch deep in your calf. 4. Hold this position for  __________ seconds. Repeat __________ times. Complete this exercise __________ times a day. Exercise D: Gastrocsoleus, standing 1. Stand with the ball of your left / right foot on a step. The ball of your foot is on the walking surface, right under your toes. 2. Keep your other foot firmly on the same step. 3. Hold onto the wall or a railing for balance. 4. Slowly lift your other foot, allowing your body weight to press your heel down over the edge of the step. You should feel a stretch in your left / right calf. 5. Hold this position for __________ seconds. 6. Return both feet to the step. 7. Repeat this exercise with a slight bend in your left / right knee. Repeat __________ times with your left / right knee straight and __________ times with your left / right knee bent. Complete this exercise __________ times a day. Balance exercise This exercise builds your balance and strength control of your arch to help take pressure off your plantar fascia. Exercise E: Single leg stand 1. Without shoes, stand near a railing or in a doorway. You may hold onto the railing or door frame as needed. 2. Stand on your left / right foot. Keep your big toe down on the floor and try to keep your arch lifted. Do not let your foot roll inward. 3. Hold this position for __________ seconds. 4. If this exercise is too easy, you can try it with your eyes closed or while standing on a pillow. Repeat __________ times. Complete  this exercise __________ times a day. This information is not intended to replace advice given to you by your health care provider. Make sure you discuss any questions you have with your health care provider. Document Released: 01/05/2005 Document Revised: 09/10/2015 Document Reviewed: 11/19/2014 Elsevier Interactive Patient Education  2018 [employer].  Achilles Tendinitis Achilles tendinitis is inflammation of the tough, cord-like band that attaches the lower leg muscles to the heel bone  (Achilles tendon). This is usually caused by overusing the tendon and the ankle joint. Achilles tendinitis usually gets better over time with treatment and caring for yourself at home. It can take weeks or months to heal completely. What are the causes? This condition may be caused by:  A sudden increase in exercise or activity, such as running.  Doing the same exercises or activities (such as jumping) over and over.  Not warming up calf muscles before exercising.  Exercising in shoes that are worn out or not made for exercise.  Having arthritis or a bone growth (spur) on the back of the heel bone. This can rub against the tendon and hurt it.  Age-related wear and tear. Tendons become less flexible with age and more likely to be injured.  What are the signs or symptoms? Common symptoms of this condition include:  Pain in the Achilles tendon or in the back of the leg, just above the heel. The pain usually gets worse with exercise.  Stiffness or soreness in the back of the leg, especially in the morning.  Swelling of the skin over the Achilles tendon.  Thickening of the tendon.  Bone spurs at the bottom of the Achilles tendon, near the heel.  Trouble standing on tiptoe.  How is this diagnosed? This condition is diagnosed based on your symptoms and a physical exam. You may have tests, including:  X-rays.  MRI.  How is this treated? The goal of treatment is to relieve symptoms and help your injury heal. Treatment may include:  Decreasing or stopping activities that caused the tendinitis. This may mean switching to low-impact exercises like biking or swimming.  Icing the injured area.  Doing physical therapy, including strengthening and stretching exercises.  NSAIDs to help relieve pain and swelling.  Using supportive shoes, wraps, heel lifts, or a walking boot (air cast).  Surgery. This may be done if your symptoms do not improve after 6 months.  Using high-energy  shock wave impulses to stimulate the healing process (extracorporeal shock wave therapy). This is rare.  Injection of medicines to help relieve inflammation (corticosteroids). This is rare.  Follow these instructions at home: If you have an air cast:  Wear the cast as told by your health care provider. Remove it only as told by your health care provider.  Loosen the cast if your toes tingle, become numb, or turn cold and blue. Activity  Gradually return to your normal activities once your health care provider approves. Do not do activities that cause pain. ? Consider doing low-impact exercises, like cycling or swimming.  If you have an air cast, ask your health care provider when it is safe for you to drive.  If physical therapy was prescribed, do exercises as told by your health care provider or physical therapist. Managing pain, stiffness, and swelling  Raise (elevate) your foot above the level of your heart while you are sitting or lying down.  Move your toes often to avoid stiffness and to lessen swelling.  If directed, put ice on the injured area: ?  Put ice in a plastic bag. ? Place a towel between your skin and the bag. ? Leave the ice on for 20 minutes, 2-3 times a day General instructions  If directed, wrap your foot with an elastic bandage or other wrap. This can help keep your tendon from moving too much while it heals. Your health care provider will show you how to wrap your foot correctly.  Wear supportive shoes or heel lifts only as told by your health care provider.  Take over-the-counter and prescription medicines only as told by your health care provider.  Keep all follow-up visits as told by your health care provider. This is important. Contact a health care provider if:  You have symptoms that gets worse.  You have pain that does not get better with medicine.  You develop new, unexplained symptoms.  You develop warmth and swelling in your foot.  You  have a fever. Get help right away if:  You have a sudden popping sound or sensation in your Achilles tendon followed by severe pain.  You cannot move your toes or foot.  You cannot put any weight on your foot. Summary  Achilles tendinitis is inflammation of the tough, cord-like band that attaches the lower leg muscles to the heel bone (Achilles tendon).  This condition is usually caused by overusing the tendon and the ankle joint. It can also be caused by arthritis or normal aging.  The most common symptoms of this condition include pain, swelling, or stiffness in the Achilles tendon or in the back of the leg.  This condition is usually treated with rest, NSAIDs, and physical therapy. This information is not intended to replace advice given to you by your health care provider. Make sure you discuss any questions you have with your health care provider. Document Released: 10/15/2004 Document Revised: 11/25/2015 Document Reviewed: 11/25/2015 Elsevier Interactive Patient Education  2017 [employer].

## 2017-09-15 DIAGNOSIS — R531 Weakness: Secondary | ICD-10-CM | POA: Diagnosis not present

## 2017-09-16 DIAGNOSIS — M6281 Muscle weakness (generalized): Secondary | ICD-10-CM | POA: Diagnosis not present

## 2017-09-16 DIAGNOSIS — R531 Weakness: Secondary | ICD-10-CM | POA: Diagnosis not present

## 2017-09-16 DIAGNOSIS — R269 Unspecified abnormalities of gait and mobility: Secondary | ICD-10-CM | POA: Diagnosis not present

## 2017-09-16 DIAGNOSIS — M79672 Pain in left foot: Secondary | ICD-10-CM | POA: Diagnosis not present

## 2017-09-16 DIAGNOSIS — M79671 Pain in right foot: Secondary | ICD-10-CM | POA: Diagnosis not present

## 2017-09-17 DIAGNOSIS — R531 Weakness: Secondary | ICD-10-CM | POA: Diagnosis not present

## 2017-09-18 DIAGNOSIS — R531 Weakness: Secondary | ICD-10-CM | POA: Diagnosis not present

## 2017-09-19 DIAGNOSIS — R531 Weakness: Secondary | ICD-10-CM | POA: Diagnosis not present

## 2017-09-20 DIAGNOSIS — R531 Weakness: Secondary | ICD-10-CM | POA: Diagnosis not present

## 2017-09-21 DIAGNOSIS — M6281 Muscle weakness (generalized): Secondary | ICD-10-CM | POA: Diagnosis not present

## 2017-09-21 DIAGNOSIS — R269 Unspecified abnormalities of gait and mobility: Secondary | ICD-10-CM | POA: Diagnosis not present

## 2017-09-21 DIAGNOSIS — M79671 Pain in right foot: Secondary | ICD-10-CM | POA: Diagnosis not present

## 2017-09-21 DIAGNOSIS — M79672 Pain in left foot: Secondary | ICD-10-CM | POA: Diagnosis not present

## 2017-09-21 DIAGNOSIS — R531 Weakness: Secondary | ICD-10-CM | POA: Diagnosis not present

## 2017-09-22 DIAGNOSIS — R531 Weakness: Secondary | ICD-10-CM | POA: Diagnosis not present

## 2017-09-23 DIAGNOSIS — M79672 Pain in left foot: Secondary | ICD-10-CM | POA: Diagnosis not present

## 2017-09-23 DIAGNOSIS — M79671 Pain in right foot: Secondary | ICD-10-CM | POA: Diagnosis not present

## 2017-09-23 DIAGNOSIS — R269 Unspecified abnormalities of gait and mobility: Secondary | ICD-10-CM | POA: Diagnosis not present

## 2017-09-23 DIAGNOSIS — R531 Weakness: Secondary | ICD-10-CM | POA: Diagnosis not present

## 2017-09-23 DIAGNOSIS — M6281 Muscle weakness (generalized): Secondary | ICD-10-CM | POA: Diagnosis not present

## 2017-09-24 ENCOUNTER — Ambulatory Visit (AMBULATORY_SURGERY_CENTER): Payer: Self-pay

## 2017-09-24 VITALS — Ht 67.0 in | Wt 158.2 lb

## 2017-09-24 DIAGNOSIS — R531 Weakness: Secondary | ICD-10-CM | POA: Diagnosis not present

## 2017-09-24 DIAGNOSIS — Z1211 Encounter for screening for malignant neoplasm of colon: Secondary | ICD-10-CM

## 2017-09-24 MED ORDER — NA SULFATE-K SULFATE-MG SULF 17.5-3.13-1.6 GM/177ML PO SOLN: 1.0000 | Freq: Once | ORAL | 0 refills | Status: AC

## 2017-09-24 NOTE — Progress Notes (Signed)
Per pt, no allergies to soy or egg products.Pt not taking any weight loss meds or using  O2 at home.  Pt refused emmi video. 

## 2017-09-25 DIAGNOSIS — R531 Weakness: Secondary | ICD-10-CM | POA: Diagnosis not present

## 2017-09-26 DIAGNOSIS — R531 Weakness: Secondary | ICD-10-CM | POA: Diagnosis not present

## 2017-09-27 DIAGNOSIS — R531 Weakness: Secondary | ICD-10-CM | POA: Diagnosis not present

## 2017-09-28 DIAGNOSIS — M6281 Muscle weakness (generalized): Secondary | ICD-10-CM | POA: Diagnosis not present

## 2017-09-28 DIAGNOSIS — R269 Unspecified abnormalities of gait and mobility: Secondary | ICD-10-CM | POA: Diagnosis not present

## 2017-09-28 DIAGNOSIS — R531 Weakness: Secondary | ICD-10-CM | POA: Diagnosis not present

## 2017-09-28 DIAGNOSIS — M79672 Pain in left foot: Secondary | ICD-10-CM | POA: Diagnosis not present

## 2017-09-28 DIAGNOSIS — M79671 Pain in right foot: Secondary | ICD-10-CM | POA: Diagnosis not present

## 2017-09-29 DIAGNOSIS — R531 Weakness: Secondary | ICD-10-CM | POA: Diagnosis not present

## 2017-09-30 DIAGNOSIS — M79672 Pain in left foot: Secondary | ICD-10-CM | POA: Diagnosis not present

## 2017-09-30 DIAGNOSIS — M6281 Muscle weakness (generalized): Secondary | ICD-10-CM | POA: Diagnosis not present

## 2017-09-30 DIAGNOSIS — M79671 Pain in right foot: Secondary | ICD-10-CM | POA: Diagnosis not present

## 2017-09-30 DIAGNOSIS — R531 Weakness: Secondary | ICD-10-CM | POA: Diagnosis not present

## 2017-09-30 DIAGNOSIS — R269 Unspecified abnormalities of gait and mobility: Secondary | ICD-10-CM | POA: Diagnosis not present

## 2017-10-01 DIAGNOSIS — R531 Weakness: Secondary | ICD-10-CM | POA: Diagnosis not present

## 2017-10-02 DIAGNOSIS — R531 Weakness: Secondary | ICD-10-CM | POA: Diagnosis not present

## 2017-10-03 DIAGNOSIS — R531 Weakness: Secondary | ICD-10-CM | POA: Diagnosis not present

## 2017-10-04 DIAGNOSIS — R531 Weakness: Secondary | ICD-10-CM | POA: Diagnosis not present

## 2017-10-05 DIAGNOSIS — M79671 Pain in right foot: Secondary | ICD-10-CM | POA: Diagnosis not present

## 2017-10-05 DIAGNOSIS — M79672 Pain in left foot: Secondary | ICD-10-CM | POA: Diagnosis not present

## 2017-10-05 DIAGNOSIS — R269 Unspecified abnormalities of gait and mobility: Secondary | ICD-10-CM | POA: Diagnosis not present

## 2017-10-05 DIAGNOSIS — R531 Weakness: Secondary | ICD-10-CM | POA: Diagnosis not present

## 2017-10-05 DIAGNOSIS — M6281 Muscle weakness (generalized): Secondary | ICD-10-CM | POA: Diagnosis not present

## 2017-10-06 DIAGNOSIS — R531 Weakness: Secondary | ICD-10-CM | POA: Diagnosis not present

## 2017-10-07 ENCOUNTER — Encounter: Payer: Self-pay | Admitting: Gastroenterology

## 2017-10-07 ENCOUNTER — Ambulatory Visit (AMBULATORY_SURGERY_CENTER): Payer: Medicare HMO | Admitting: Gastroenterology

## 2017-10-07 VITALS — BP 112/77 | HR 48 | Temp 97.3°F | Resp 28 | Ht 67.0 in | Wt 158.0 lb

## 2017-10-07 DIAGNOSIS — D120 Benign neoplasm of cecum: Secondary | ICD-10-CM

## 2017-10-07 DIAGNOSIS — R531 Weakness: Secondary | ICD-10-CM | POA: Diagnosis not present

## 2017-10-07 DIAGNOSIS — D122 Benign neoplasm of ascending colon: Secondary | ICD-10-CM | POA: Diagnosis not present

## 2017-10-07 DIAGNOSIS — Z1211 Encounter for screening for malignant neoplasm of colon: Secondary | ICD-10-CM

## 2017-10-07 DIAGNOSIS — K219 Gastro-esophageal reflux disease without esophagitis: Secondary | ICD-10-CM | POA: Diagnosis not present

## 2017-10-07 DIAGNOSIS — I10 Essential (primary) hypertension: Secondary | ICD-10-CM | POA: Diagnosis not present

## 2017-10-07 MED ORDER — SODIUM CHLORIDE 0.9 % IV SOLN: 500.0000 mL | Freq: Once | INTRAVENOUS | Status: DC

## 2017-10-07 NOTE — Op Note (Signed)
[facility] Patient Name: Jordan Bass Procedure Date: 10/07/2017 9:44 AM MRN: [id number] Endoscopist: Justice Britain , MD Age: 62 Referring MD:  Date of Birth: 25-Sep-1955 Gender: Female Account #: [id number] Procedure:                Colonoscopy Indications:              Screening for colorectal malignant neoplasm,                            Screening for colorectal malignant neoplasm (last                            colonoscopy was 10 years ago) Medicines:                Monitored Anesthesia Care Procedure:                Pre-Anesthesia Assessment:                           - Prior to the procedure, a History and Physical                            was performed, and patient medications and                            allergies were reviewed. The patient's tolerance of                            previous anesthesia was also reviewed. The risks                            and benefits of the procedure and the sedation                            options and risks were discussed with the patient.                            All questions were answered, and informed consent                            was obtained. Prior Anticoagulants: The patient has                            taken no previous anticoagulant or antiplatelet                            agents. ASA Grade Assessment: II - A patient with                            mild systemic disease. After reviewing the risks                            and benefits, the patient was deemed in  satisfactory condition to undergo the procedure.                           After obtaining informed consent, the colonoscope                            was passed under direct vision. Throughout the                            procedure, the patient's blood pressure, pulse, and                            oxygen saturations were monitored continuously. The                            Model PCF-[id number])  scope was introduced                            through the anus and advanced to the 5 cm into the                            ileum. The colonoscopy was performed without                            difficulty. The patient tolerated the procedure.                            The quality of the bowel preparation was evaluated                            using the [facility] Bowel Preparation Scale)                            with scores of: Right Colon = 3 (entire mucosa seen                            well with no residual staining, small fragments of                            stool or opaque liquid), Transverse Colon = 2                            (minor amount of residual staining, small fragments                            of stool and/or opaque liquid, but mucosa seen                            well) and Left Colon = 2 (minor amount of residual                            staining, small fragments of stool and/or opaque  liquid, but mucosa seen well). The total BBPS score                            equals 7. The quality of the bowel preparation was                            fair. Scope In: 9:50:47 AM Scope Out: 10:11:51 AM Scope Withdrawal Time: 0 hours 18 minutes 19 seconds  Total Procedure Duration: 0 hours 21 minutes 4 seconds  Findings:                 The digital rectal exam was normal. Pertinent                            negatives include no palpable rectal lesions.                           The terminal ileum and ileocecal valve appeared                            normal.                           A moderate amount of semi-liquid stool was found in                            the recto-sigmoid colon, in the sigmoid colon, in                            the descending colon and in the transverse colon,                            interfering with visualization. Lavage of the area                            was performed using copious amounts, resulting in                             clearance with fair visualization.                           Three sessile polyps were found in the ascending                            colon (2) and cecum (1). The polyps were 1 to 2 mm                            in size. These polyps were removed with a jumbo                            cold forceps. Resection and retrieval were complete.                           Four sessile polyps were found in the  ascending                            colon. The polyps were 3 to 7 mm in size. These                            polyps were removed with a cold snare. Resection                            and retrieval were complete.                           A few small and large-mouthed diverticula were                            found in the sigmoid colon, ascending colon and                            cecum.                           Non-bleeding non-thrombosed internal hemorrhoids                            were found during retroflexion. The hemorrhoids                            were Grade I (internal hemorrhoids that do not                            prolapse). Complications:            No immediate complications. Estimated Blood Loss:     Estimated blood loss was minimal. Impression:               - Preparation of the colon was fair.                           - The examined portion of the ileum was normal.                           - Stool in the recto-sigmoid colon, in the sigmoid                            colon, in the descending colon and in the                            transverse colon.                           - Three 1 to 2 mm polyps in the ascending colon (2)                            and in the cecum (1), removed with a jumbo cold  forceps. Resected and retrieved.                           - Four 3 to 7 mm polyps in the ascending colon,                            removed with a cold snare. Resected and retrieved.                            - Diverticulosis in the sigmoid colon, in the                            ascending colon and in the cecum.                           - Non-bleeding non-thrombosed internal hemorrhoids. Recommendation:           - The patient will be observed post-procedure,                            until all discharge criteria are met.                           - Discharge patient to home.                           - Patient has a contact number available for                            emergencies. The signs and symptoms of potential                            delayed complications were discussed with the                            patient. Return to normal activities tomorrow.                            Written discharge instructions were provided to the                            patient.                           - Resume previous diet.                           - Continue present medications.                           - Await pathology results.                           - Repeat colonoscopy in 3 years for surveillance  based on pathology results. Would consider use of a                            different preparation as this was only fair after                            copious irrigation/lavage was performed.                           - The findings and recommendations were discussed                            with the patient.                           - The findings and recommendations were discussed                            with the designated responsible adult. Justice Britain, MD 10/07/2017 10:19:50 AM

## 2017-10-07 NOTE — Progress Notes (Signed)
No changes in medical or surgical hx since PV per pt 

## 2017-10-07 NOTE — Progress Notes (Signed)
Called to room to assist during endoscopic procedure.  Patient ID and intended procedure confirmed with present staff. Received instructions for my participation in the procedure from the performing physician.  

## 2017-10-07 NOTE — Progress Notes (Signed)
Report to PACU, RN, vss, BBS= Clear.  

## 2017-10-08 ENCOUNTER — Telehealth: Payer: Self-pay

## 2017-10-08 DIAGNOSIS — R531 Weakness: Secondary | ICD-10-CM | POA: Diagnosis not present

## 2017-10-08 NOTE — Telephone Encounter (Signed)
  Follow up Call-  Call back number 10/07/2017  Post procedure Call Back phone  # [phone]  Permission to leave phone message Yes  Some recent data might be hidden     Patient questions:  Do you have a fever, pain , or abdominal swelling? No. Pain Score  0 *  Have you tolerated food without any problems? Yes.    Have you been able to return to your normal activities? Yes.    Do you have any questions about your discharge instructions: Diet   No. Medications  No. Follow up visit  No.  Do you have questions or concerns about your Care? No.  Actions: * If pain score is 4 or above: No action needed, pain <4.

## 2017-10-09 DIAGNOSIS — R531 Weakness: Secondary | ICD-10-CM | POA: Diagnosis not present

## 2017-10-10 DIAGNOSIS — R531 Weakness: Secondary | ICD-10-CM | POA: Diagnosis not present

## 2017-10-11 ENCOUNTER — Encounter: Payer: Self-pay | Admitting: Gastroenterology

## 2017-10-11 DIAGNOSIS — R531 Weakness: Secondary | ICD-10-CM | POA: Diagnosis not present

## 2017-10-12 ENCOUNTER — Ambulatory Visit: Payer: Medicare HMO | Admitting: Sports Medicine

## 2017-10-12 DIAGNOSIS — M79672 Pain in left foot: Secondary | ICD-10-CM | POA: Diagnosis not present

## 2017-10-12 DIAGNOSIS — R269 Unspecified abnormalities of gait and mobility: Secondary | ICD-10-CM | POA: Diagnosis not present

## 2017-10-12 DIAGNOSIS — M6281 Muscle weakness (generalized): Secondary | ICD-10-CM | POA: Diagnosis not present

## 2017-10-12 DIAGNOSIS — R531 Weakness: Secondary | ICD-10-CM | POA: Diagnosis not present

## 2017-10-12 DIAGNOSIS — M79671 Pain in right foot: Secondary | ICD-10-CM | POA: Diagnosis not present

## 2017-10-13 DIAGNOSIS — R531 Weakness: Secondary | ICD-10-CM | POA: Diagnosis not present

## 2017-10-14 DIAGNOSIS — M79672 Pain in left foot: Secondary | ICD-10-CM | POA: Diagnosis not present

## 2017-10-14 DIAGNOSIS — M79671 Pain in right foot: Secondary | ICD-10-CM | POA: Diagnosis not present

## 2017-10-14 DIAGNOSIS — M6281 Muscle weakness (generalized): Secondary | ICD-10-CM | POA: Diagnosis not present

## 2017-10-14 DIAGNOSIS — R269 Unspecified abnormalities of gait and mobility: Secondary | ICD-10-CM | POA: Diagnosis not present

## 2017-10-14 DIAGNOSIS — R531 Weakness: Secondary | ICD-10-CM | POA: Diagnosis not present

## 2017-10-15 DIAGNOSIS — R531 Weakness: Secondary | ICD-10-CM | POA: Diagnosis not present

## 2017-10-16 DIAGNOSIS — R531 Weakness: Secondary | ICD-10-CM | POA: Diagnosis not present

## 2017-10-17 DIAGNOSIS — R531 Weakness: Secondary | ICD-10-CM | POA: Diagnosis not present

## 2017-10-18 DIAGNOSIS — R531 Weakness: Secondary | ICD-10-CM | POA: Diagnosis not present

## 2017-10-19 DIAGNOSIS — R531 Weakness: Secondary | ICD-10-CM | POA: Diagnosis not present

## 2017-10-20 DIAGNOSIS — R531 Weakness: Secondary | ICD-10-CM | POA: Diagnosis not present

## 2017-10-21 DIAGNOSIS — R531 Weakness: Secondary | ICD-10-CM | POA: Diagnosis not present

## 2017-10-22 DIAGNOSIS — R531 Weakness: Secondary | ICD-10-CM | POA: Diagnosis not present

## 2017-10-23 DIAGNOSIS — R531 Weakness: Secondary | ICD-10-CM | POA: Diagnosis not present

## 2017-10-24 DIAGNOSIS — R531 Weakness: Secondary | ICD-10-CM | POA: Diagnosis not present

## 2017-10-25 DIAGNOSIS — R531 Weakness: Secondary | ICD-10-CM | POA: Diagnosis not present

## 2017-10-26 ENCOUNTER — Ambulatory Visit: Payer: Medicare HMO | Admitting: Sports Medicine

## 2017-10-26 DIAGNOSIS — R531 Weakness: Secondary | ICD-10-CM | POA: Diagnosis not present

## 2017-10-27 DIAGNOSIS — R531 Weakness: Secondary | ICD-10-CM | POA: Diagnosis not present

## 2017-10-28 DIAGNOSIS — R531 Weakness: Secondary | ICD-10-CM | POA: Diagnosis not present

## 2017-10-29 DIAGNOSIS — R531 Weakness: Secondary | ICD-10-CM | POA: Diagnosis not present

## 2017-10-30 DIAGNOSIS — R531 Weakness: Secondary | ICD-10-CM | POA: Diagnosis not present

## 2017-10-31 DIAGNOSIS — R531 Weakness: Secondary | ICD-10-CM | POA: Diagnosis not present

## 2017-11-01 DIAGNOSIS — R531 Weakness: Secondary | ICD-10-CM | POA: Diagnosis not present

## 2017-11-02 DIAGNOSIS — R531 Weakness: Secondary | ICD-10-CM | POA: Diagnosis not present

## 2017-11-03 DIAGNOSIS — R531 Weakness: Secondary | ICD-10-CM | POA: Diagnosis not present

## 2017-11-04 DIAGNOSIS — R531 Weakness: Secondary | ICD-10-CM | POA: Diagnosis not present

## 2017-11-05 DIAGNOSIS — R531 Weakness: Secondary | ICD-10-CM | POA: Diagnosis not present

## 2017-11-06 DIAGNOSIS — R531 Weakness: Secondary | ICD-10-CM | POA: Diagnosis not present

## 2017-11-07 DIAGNOSIS — R531 Weakness: Secondary | ICD-10-CM | POA: Diagnosis not present

## 2017-11-08 DIAGNOSIS — R531 Weakness: Secondary | ICD-10-CM | POA: Diagnosis not present

## 2017-11-09 DIAGNOSIS — R531 Weakness: Secondary | ICD-10-CM | POA: Diagnosis not present

## 2017-11-10 DIAGNOSIS — R531 Weakness: Secondary | ICD-10-CM | POA: Diagnosis not present

## 2017-11-11 DIAGNOSIS — R531 Weakness: Secondary | ICD-10-CM | POA: Diagnosis not present

## 2017-11-12 DIAGNOSIS — R531 Weakness: Secondary | ICD-10-CM | POA: Diagnosis not present

## 2017-11-13 DIAGNOSIS — R531 Weakness: Secondary | ICD-10-CM | POA: Diagnosis not present

## 2017-11-14 DIAGNOSIS — R531 Weakness: Secondary | ICD-10-CM | POA: Diagnosis not present

## 2017-11-15 DIAGNOSIS — R531 Weakness: Secondary | ICD-10-CM | POA: Diagnosis not present

## 2017-11-16 ENCOUNTER — Other Ambulatory Visit: Payer: Self-pay | Admitting: Sports Medicine

## 2017-11-16 ENCOUNTER — Ambulatory Visit (INDEPENDENT_AMBULATORY_CARE_PROVIDER_SITE_OTHER): Payer: Medicare HMO | Admitting: Sports Medicine

## 2017-11-16 ENCOUNTER — Ambulatory Visit (INDEPENDENT_AMBULATORY_CARE_PROVIDER_SITE_OTHER): Payer: Medicare HMO

## 2017-11-16 ENCOUNTER — Encounter: Payer: Self-pay | Admitting: Sports Medicine

## 2017-11-16 DIAGNOSIS — M722 Plantar fascial fibromatosis: Secondary | ICD-10-CM

## 2017-11-16 DIAGNOSIS — M7662 Achilles tendinitis, left leg: Secondary | ICD-10-CM

## 2017-11-16 DIAGNOSIS — M79672 Pain in left foot: Secondary | ICD-10-CM | POA: Diagnosis not present

## 2017-11-16 DIAGNOSIS — M7661 Achilles tendinitis, right leg: Secondary | ICD-10-CM

## 2017-11-16 DIAGNOSIS — R2681 Unsteadiness on feet: Secondary | ICD-10-CM | POA: Diagnosis not present

## 2017-11-16 DIAGNOSIS — Z8673 Personal history of transient ischemic attack (TIA), and cerebral infarction without residual deficits: Secondary | ICD-10-CM

## 2017-11-16 DIAGNOSIS — M779 Enthesopathy, unspecified: Principal | ICD-10-CM

## 2017-11-16 DIAGNOSIS — M79671 Pain in right foot: Secondary | ICD-10-CM

## 2017-11-16 DIAGNOSIS — S99922A Unspecified injury of left foot, initial encounter: Secondary | ICD-10-CM

## 2017-11-16 DIAGNOSIS — M1990 Unspecified osteoarthritis, unspecified site: Secondary | ICD-10-CM

## 2017-11-16 DIAGNOSIS — R29898 Other symptoms and signs involving the musculoskeletal system: Secondary | ICD-10-CM | POA: Diagnosis not present

## 2017-11-16 DIAGNOSIS — R531 Weakness: Secondary | ICD-10-CM | POA: Diagnosis not present

## 2017-11-16 DIAGNOSIS — M778 Other enthesopathies, not elsewhere classified: Secondary | ICD-10-CM

## 2017-11-16 NOTE — Progress Notes (Signed)
Subjective: Jordan Bass is a 62 y.o. female patient who returns to office for generalized foot pain reports that her heel still hurt and states that after her last visit about 2 weeks ago she had a fall and that her foot is hurting all over the pain radiates especially on the left up to the knee sharp when walking states that she is taking aspirin for the pain.  Patient denies any nausea vomiting fever chills and reports that as far as physical therapy she has not been only went to 2 sessions.  Patient denies any other acute symptoms at this time.    Patient Active Problem List   Diagnosis Date Noted  . Arthritis 08/11/2017  . Depression 08/11/2017  . High blood pressure 08/11/2017  . Stroke (cerebrum) ([place]) 08/11/2017  . Chicken pox 08/11/2017    Current Outpatient Medications on File Prior to Visit  Medication Sig Dispense Refill  . amLODipine (NORVASC) 5 MG tablet     . aspirin EC 81 MG tablet Take 81 mg by mouth daily.    . Cholecalciferol (VITAMIN D3) 1000 units CAPS Take by mouth.    . famotidine (PEPCID) 20 MG tablet     . gabapentin (NEURONTIN) 600 MG tablet Take 1 tablet (600 mg total) by mouth 3 (three) times daily. 270 tablet 0  . levothyroxine (SYNTHROID, LEVOTHROID) 25 MCG tablet     . lisinopril (PRINIVIL,ZESTRIL) 10 MG tablet     . methylPREDNISolone (MEDROL DOSEPAK) 4 MG TBPK tablet Take as directed 21 tablet 0  . metoprolol tartrate (LOPRESSOR) 25 MG tablet Take 1 tablet (25 mg total) by mouth 2 (two) times daily. 180 tablet 1  . rosuvastatin (CRESTOR) 5 MG tablet     . spironolactone (ALDACTONE) 25 MG tablet daily.      No current facility-administered medications on file prior to visit.     Allergies  Allergen Reactions  . Seasonal Ic [Cholestatin]     No meds per pt.    Objective:  General: Alert and oriented x3 in no acute distress  Dermatology: No open lesions bilateral lower extremities, no webspace macerations, no ecchymosis bilateral, all  nails x 10 are well manicured.  Vascular: Dorsalis Pedis and Posterior Tibial pedal pulses 1/4, Capillary Fill Time 3 seconds, + pedal hair growth bilateral, trace edema bilateral lower extremities with the left being slightly worse than the right likely secondary to history of stroke, Temperature gradient within normal limits.  Neurology: Gross sensation intact via light touch bilateral.  Musculoskeletal: Diffuse pain all over.  Mild tenderness with palpation at insertion of the Achilles on left greater than right, there is calcaneal exostosis with mild soft tissue present and decreased ankle rom with knee extending  vs flexed resembling gastroc equnius bilateral, The achilles tendon feels intact with no nodularity or palpable dell, Thompson sign negative, Subtalar and midtarsal joint range of motion is within normal limits, there is no 1st ray hypermobility or forefoot deformity noted bilateral.  Mild pain to plantar fascial insertion bilateral and diffuse pain especially on the left that radiates up to the knee likely out of proportion which can be a result to patient's hypersensitivity due to stroke and after recent fall injury.  Pes planus foot type.  Gait: Antalgic gait with cane.  X-rays left foot no acute findings and no new changes since last x-ray to suggest any acute fracture or trauma injury at this time.  Assessment and Plan: Problem List Items Addressed This Visit  Musculoskeletal and Integument   Arthritis    Other Visit Diagnoses    Plantar fasciitis    -  Primary   Achilles tendonitis, bilateral       Heel pain, bilateral       Injury of left foot, initial encounter       Gait instability       History of embolic stroke       Left leg weakness         -Complete examination performed -Xrays reviewed -Discussed treatement options for continued generalized foot pain with gait instability after stroke and continued Achilles tendinitis and plantar fasciitis -Dispenses  surgitube compression sleeve to help with edema control -Dispensed cam boot for patient to use for the next 2 weeks and advised patient to resume physical therapy  -Patient to return to office after completing therapy or sooner if condition worsens.  Landis Martins, DPM

## 2017-11-17 DIAGNOSIS — R531 Weakness: Secondary | ICD-10-CM | POA: Diagnosis not present

## 2017-11-18 DIAGNOSIS — R531 Weakness: Secondary | ICD-10-CM | POA: Diagnosis not present

## 2017-11-19 DIAGNOSIS — R531 Weakness: Secondary | ICD-10-CM | POA: Diagnosis not present

## 2017-11-20 DIAGNOSIS — R531 Weakness: Secondary | ICD-10-CM | POA: Diagnosis not present

## 2017-11-21 DIAGNOSIS — R531 Weakness: Secondary | ICD-10-CM | POA: Diagnosis not present

## 2017-11-22 DIAGNOSIS — R531 Weakness: Secondary | ICD-10-CM | POA: Diagnosis not present

## 2017-11-23 DIAGNOSIS — R531 Weakness: Secondary | ICD-10-CM | POA: Diagnosis not present

## 2017-11-24 DIAGNOSIS — R531 Weakness: Secondary | ICD-10-CM | POA: Diagnosis not present

## 2017-11-25 DIAGNOSIS — R531 Weakness: Secondary | ICD-10-CM | POA: Diagnosis not present

## 2017-11-26 DIAGNOSIS — R531 Weakness: Secondary | ICD-10-CM | POA: Diagnosis not present

## 2017-11-27 DIAGNOSIS — R531 Weakness: Secondary | ICD-10-CM | POA: Diagnosis not present

## 2017-11-28 DIAGNOSIS — R531 Weakness: Secondary | ICD-10-CM | POA: Diagnosis not present

## 2017-11-29 DIAGNOSIS — R531 Weakness: Secondary | ICD-10-CM | POA: Diagnosis not present

## 2017-11-30 DIAGNOSIS — R531 Weakness: Secondary | ICD-10-CM | POA: Diagnosis not present

## 2017-12-01 DIAGNOSIS — R531 Weakness: Secondary | ICD-10-CM | POA: Diagnosis not present

## 2017-12-02 DIAGNOSIS — R531 Weakness: Secondary | ICD-10-CM | POA: Diagnosis not present

## 2017-12-03 DIAGNOSIS — R531 Weakness: Secondary | ICD-10-CM | POA: Diagnosis not present

## 2017-12-04 DIAGNOSIS — R531 Weakness: Secondary | ICD-10-CM | POA: Diagnosis not present

## 2017-12-05 DIAGNOSIS — R531 Weakness: Secondary | ICD-10-CM | POA: Diagnosis not present

## 2017-12-06 DIAGNOSIS — R531 Weakness: Secondary | ICD-10-CM | POA: Diagnosis not present

## 2017-12-07 DIAGNOSIS — R531 Weakness: Secondary | ICD-10-CM | POA: Diagnosis not present

## 2017-12-08 DIAGNOSIS — R531 Weakness: Secondary | ICD-10-CM | POA: Diagnosis not present

## 2017-12-09 DIAGNOSIS — R531 Weakness: Secondary | ICD-10-CM | POA: Diagnosis not present

## 2017-12-10 DIAGNOSIS — R531 Weakness: Secondary | ICD-10-CM | POA: Diagnosis not present

## 2017-12-11 DIAGNOSIS — R531 Weakness: Secondary | ICD-10-CM | POA: Diagnosis not present

## 2017-12-12 DIAGNOSIS — R531 Weakness: Secondary | ICD-10-CM | POA: Diagnosis not present

## 2017-12-13 DIAGNOSIS — R531 Weakness: Secondary | ICD-10-CM | POA: Diagnosis not present

## 2017-12-14 DIAGNOSIS — R531 Weakness: Secondary | ICD-10-CM | POA: Diagnosis not present

## 2017-12-15 DIAGNOSIS — R531 Weakness: Secondary | ICD-10-CM | POA: Diagnosis not present

## 2017-12-16 DIAGNOSIS — R531 Weakness: Secondary | ICD-10-CM | POA: Diagnosis not present

## 2017-12-17 DIAGNOSIS — R531 Weakness: Secondary | ICD-10-CM | POA: Diagnosis not present

## 2017-12-18 DIAGNOSIS — R531 Weakness: Secondary | ICD-10-CM | POA: Diagnosis not present

## 2017-12-19 DIAGNOSIS — R531 Weakness: Secondary | ICD-10-CM | POA: Diagnosis not present

## 2017-12-27 DIAGNOSIS — R531 Weakness: Secondary | ICD-10-CM | POA: Diagnosis not present

## 2017-12-28 DIAGNOSIS — R531 Weakness: Secondary | ICD-10-CM | POA: Diagnosis not present

## 2017-12-29 DIAGNOSIS — R531 Weakness: Secondary | ICD-10-CM | POA: Diagnosis not present

## 2017-12-30 DIAGNOSIS — R531 Weakness: Secondary | ICD-10-CM | POA: Diagnosis not present

## 2017-12-31 DIAGNOSIS — R531 Weakness: Secondary | ICD-10-CM | POA: Diagnosis not present

## 2018-01-01 DIAGNOSIS — R531 Weakness: Secondary | ICD-10-CM | POA: Diagnosis not present

## 2018-01-02 DIAGNOSIS — R531 Weakness: Secondary | ICD-10-CM | POA: Diagnosis not present

## 2018-01-03 DIAGNOSIS — R531 Weakness: Secondary | ICD-10-CM | POA: Diagnosis not present

## 2018-01-04 DIAGNOSIS — R531 Weakness: Secondary | ICD-10-CM | POA: Diagnosis not present

## 2018-01-05 DIAGNOSIS — R531 Weakness: Secondary | ICD-10-CM | POA: Diagnosis not present

## 2018-01-06 DIAGNOSIS — R531 Weakness: Secondary | ICD-10-CM | POA: Diagnosis not present

## 2018-01-07 DIAGNOSIS — R531 Weakness: Secondary | ICD-10-CM | POA: Diagnosis not present

## 2018-01-08 DIAGNOSIS — R531 Weakness: Secondary | ICD-10-CM | POA: Diagnosis not present

## 2018-01-09 DIAGNOSIS — R531 Weakness: Secondary | ICD-10-CM | POA: Diagnosis not present

## 2018-01-10 DIAGNOSIS — R531 Weakness: Secondary | ICD-10-CM | POA: Diagnosis not present

## 2018-01-11 DIAGNOSIS — R531 Weakness: Secondary | ICD-10-CM | POA: Diagnosis not present

## 2018-01-12 DIAGNOSIS — R531 Weakness: Secondary | ICD-10-CM | POA: Diagnosis not present

## 2018-01-13 DIAGNOSIS — R531 Weakness: Secondary | ICD-10-CM | POA: Diagnosis not present

## 2018-01-14 DIAGNOSIS — R531 Weakness: Secondary | ICD-10-CM | POA: Diagnosis not present

## 2018-01-15 DIAGNOSIS — R531 Weakness: Secondary | ICD-10-CM | POA: Diagnosis not present

## 2018-01-16 DIAGNOSIS — R531 Weakness: Secondary | ICD-10-CM | POA: Diagnosis not present

## 2018-01-17 DIAGNOSIS — R531 Weakness: Secondary | ICD-10-CM | POA: Diagnosis not present

## 2018-01-18 DIAGNOSIS — R531 Weakness: Secondary | ICD-10-CM | POA: Diagnosis not present

## 2018-01-19 DIAGNOSIS — R531 Weakness: Secondary | ICD-10-CM | POA: Diagnosis not present

## 2018-01-20 DIAGNOSIS — R531 Weakness: Secondary | ICD-10-CM | POA: Diagnosis not present

## 2018-01-21 DIAGNOSIS — R531 Weakness: Secondary | ICD-10-CM | POA: Diagnosis not present

## 2018-01-22 DIAGNOSIS — R531 Weakness: Secondary | ICD-10-CM | POA: Diagnosis not present

## 2018-01-23 DIAGNOSIS — R531 Weakness: Secondary | ICD-10-CM | POA: Diagnosis not present

## 2018-01-24 DIAGNOSIS — R531 Weakness: Secondary | ICD-10-CM | POA: Diagnosis not present

## 2018-01-25 DIAGNOSIS — R531 Weakness: Secondary | ICD-10-CM | POA: Diagnosis not present

## 2018-01-26 DIAGNOSIS — R531 Weakness: Secondary | ICD-10-CM | POA: Diagnosis not present

## 2018-01-27 DIAGNOSIS — R531 Weakness: Secondary | ICD-10-CM | POA: Diagnosis not present

## 2018-01-28 DIAGNOSIS — R531 Weakness: Secondary | ICD-10-CM | POA: Diagnosis not present

## 2018-01-29 DIAGNOSIS — R531 Weakness: Secondary | ICD-10-CM | POA: Diagnosis not present

## 2018-01-30 DIAGNOSIS — R531 Weakness: Secondary | ICD-10-CM | POA: Diagnosis not present

## 2018-01-31 DIAGNOSIS — R531 Weakness: Secondary | ICD-10-CM | POA: Diagnosis not present

## 2018-02-01 DIAGNOSIS — R531 Weakness: Secondary | ICD-10-CM | POA: Diagnosis not present

## 2018-02-02 DIAGNOSIS — R531 Weakness: Secondary | ICD-10-CM | POA: Diagnosis not present

## 2018-02-03 DIAGNOSIS — R531 Weakness: Secondary | ICD-10-CM | POA: Diagnosis not present

## 2018-02-04 DIAGNOSIS — R531 Weakness: Secondary | ICD-10-CM | POA: Diagnosis not present

## 2018-02-05 DIAGNOSIS — R531 Weakness: Secondary | ICD-10-CM | POA: Diagnosis not present

## 2018-02-06 DIAGNOSIS — R531 Weakness: Secondary | ICD-10-CM | POA: Diagnosis not present

## 2018-02-14 DIAGNOSIS — R531 Weakness: Secondary | ICD-10-CM | POA: Diagnosis not present

## 2018-02-15 DIAGNOSIS — R531 Weakness: Secondary | ICD-10-CM | POA: Diagnosis not present

## 2018-02-16 DIAGNOSIS — R531 Weakness: Secondary | ICD-10-CM | POA: Diagnosis not present

## 2018-02-17 DIAGNOSIS — R531 Weakness: Secondary | ICD-10-CM | POA: Diagnosis not present

## 2018-02-18 DIAGNOSIS — R531 Weakness: Secondary | ICD-10-CM | POA: Diagnosis not present

## 2018-02-19 DIAGNOSIS — R531 Weakness: Secondary | ICD-10-CM | POA: Diagnosis not present

## 2018-02-20 DIAGNOSIS — R531 Weakness: Secondary | ICD-10-CM | POA: Diagnosis not present

## 2018-02-21 DIAGNOSIS — R531 Weakness: Secondary | ICD-10-CM | POA: Diagnosis not present

## 2018-02-22 DIAGNOSIS — R531 Weakness: Secondary | ICD-10-CM | POA: Diagnosis not present

## 2018-02-23 DIAGNOSIS — R531 Weakness: Secondary | ICD-10-CM | POA: Diagnosis not present

## 2018-02-24 DIAGNOSIS — R531 Weakness: Secondary | ICD-10-CM | POA: Diagnosis not present

## 2018-02-25 DIAGNOSIS — R531 Weakness: Secondary | ICD-10-CM | POA: Diagnosis not present

## 2018-02-26 DIAGNOSIS — R531 Weakness: Secondary | ICD-10-CM | POA: Diagnosis not present

## 2018-02-27 DIAGNOSIS — R531 Weakness: Secondary | ICD-10-CM | POA: Diagnosis not present

## 2018-02-28 DIAGNOSIS — R531 Weakness: Secondary | ICD-10-CM | POA: Diagnosis not present

## 2018-03-01 DIAGNOSIS — R531 Weakness: Secondary | ICD-10-CM | POA: Diagnosis not present

## 2018-03-02 DIAGNOSIS — R531 Weakness: Secondary | ICD-10-CM | POA: Diagnosis not present

## 2018-03-03 DIAGNOSIS — R531 Weakness: Secondary | ICD-10-CM | POA: Diagnosis not present

## 2018-03-04 DIAGNOSIS — R531 Weakness: Secondary | ICD-10-CM | POA: Diagnosis not present

## 2018-03-05 DIAGNOSIS — R531 Weakness: Secondary | ICD-10-CM | POA: Diagnosis not present

## 2018-03-06 DIAGNOSIS — R531 Weakness: Secondary | ICD-10-CM | POA: Diagnosis not present

## 2018-03-07 DIAGNOSIS — R531 Weakness: Secondary | ICD-10-CM | POA: Diagnosis not present

## 2018-03-08 DIAGNOSIS — R531 Weakness: Secondary | ICD-10-CM | POA: Diagnosis not present

## 2018-03-09 DIAGNOSIS — R531 Weakness: Secondary | ICD-10-CM | POA: Diagnosis not present

## 2018-03-10 DIAGNOSIS — R531 Weakness: Secondary | ICD-10-CM | POA: Diagnosis not present

## 2018-03-11 DIAGNOSIS — R531 Weakness: Secondary | ICD-10-CM | POA: Diagnosis not present

## 2018-03-12 DIAGNOSIS — R531 Weakness: Secondary | ICD-10-CM | POA: Diagnosis not present

## 2018-03-13 DIAGNOSIS — R531 Weakness: Secondary | ICD-10-CM | POA: Diagnosis not present

## 2018-03-14 DIAGNOSIS — R531 Weakness: Secondary | ICD-10-CM | POA: Diagnosis not present

## 2018-03-15 DIAGNOSIS — R531 Weakness: Secondary | ICD-10-CM | POA: Diagnosis not present

## 2018-03-16 DIAGNOSIS — R531 Weakness: Secondary | ICD-10-CM | POA: Diagnosis not present

## 2018-03-17 DIAGNOSIS — R531 Weakness: Secondary | ICD-10-CM | POA: Diagnosis not present

## 2018-03-18 DIAGNOSIS — R531 Weakness: Secondary | ICD-10-CM | POA: Diagnosis not present

## 2018-03-19 DIAGNOSIS — R531 Weakness: Secondary | ICD-10-CM | POA: Diagnosis not present

## 2018-03-20 DIAGNOSIS — R531 Weakness: Secondary | ICD-10-CM | POA: Diagnosis not present

## 2018-03-21 DIAGNOSIS — R531 Weakness: Secondary | ICD-10-CM | POA: Diagnosis not present

## 2018-03-22 DIAGNOSIS — R531 Weakness: Secondary | ICD-10-CM | POA: Diagnosis not present

## 2018-03-23 DIAGNOSIS — R531 Weakness: Secondary | ICD-10-CM | POA: Diagnosis not present

## 2018-03-24 DIAGNOSIS — R531 Weakness: Secondary | ICD-10-CM | POA: Diagnosis not present

## 2018-03-25 DIAGNOSIS — R531 Weakness: Secondary | ICD-10-CM | POA: Diagnosis not present

## 2018-03-26 DIAGNOSIS — R531 Weakness: Secondary | ICD-10-CM | POA: Diagnosis not present

## 2018-03-27 DIAGNOSIS — R531 Weakness: Secondary | ICD-10-CM | POA: Diagnosis not present

## 2018-03-28 DIAGNOSIS — R531 Weakness: Secondary | ICD-10-CM | POA: Diagnosis not present

## 2018-03-29 DIAGNOSIS — R531 Weakness: Secondary | ICD-10-CM | POA: Diagnosis not present

## 2018-03-30 DIAGNOSIS — R531 Weakness: Secondary | ICD-10-CM | POA: Diagnosis not present

## 2018-03-31 DIAGNOSIS — R531 Weakness: Secondary | ICD-10-CM | POA: Diagnosis not present

## 2018-04-01 DIAGNOSIS — R531 Weakness: Secondary | ICD-10-CM | POA: Diagnosis not present

## 2018-04-02 DIAGNOSIS — R531 Weakness: Secondary | ICD-10-CM | POA: Diagnosis not present

## 2018-04-03 DIAGNOSIS — R531 Weakness: Secondary | ICD-10-CM | POA: Diagnosis not present

## 2018-04-04 DIAGNOSIS — R531 Weakness: Secondary | ICD-10-CM | POA: Diagnosis not present

## 2018-04-05 DIAGNOSIS — R531 Weakness: Secondary | ICD-10-CM | POA: Diagnosis not present

## 2018-04-06 DIAGNOSIS — R531 Weakness: Secondary | ICD-10-CM | POA: Diagnosis not present

## 2018-04-07 DIAGNOSIS — R531 Weakness: Secondary | ICD-10-CM | POA: Diagnosis not present

## 2018-04-08 DIAGNOSIS — R531 Weakness: Secondary | ICD-10-CM | POA: Diagnosis not present

## 2018-04-09 DIAGNOSIS — R531 Weakness: Secondary | ICD-10-CM | POA: Diagnosis not present

## 2018-04-10 DIAGNOSIS — R531 Weakness: Secondary | ICD-10-CM | POA: Diagnosis not present

## 2018-04-20 DIAGNOSIS — E039 Hypothyroidism, unspecified: Secondary | ICD-10-CM | POA: Diagnosis not present

## 2018-04-20 DIAGNOSIS — E559 Vitamin D deficiency, unspecified: Secondary | ICD-10-CM | POA: Diagnosis not present

## 2018-04-20 DIAGNOSIS — E7800 Pure hypercholesterolemia, unspecified: Secondary | ICD-10-CM | POA: Diagnosis not present

## 2018-06-20 DIAGNOSIS — R531 Weakness: Secondary | ICD-10-CM | POA: Diagnosis not present

## 2018-06-21 DIAGNOSIS — R531 Weakness: Secondary | ICD-10-CM | POA: Diagnosis not present

## 2018-06-22 DIAGNOSIS — R531 Weakness: Secondary | ICD-10-CM | POA: Diagnosis not present

## 2018-06-23 DIAGNOSIS — R531 Weakness: Secondary | ICD-10-CM | POA: Diagnosis not present

## 2018-06-24 DIAGNOSIS — R531 Weakness: Secondary | ICD-10-CM | POA: Diagnosis not present

## 2018-06-25 DIAGNOSIS — R531 Weakness: Secondary | ICD-10-CM | POA: Diagnosis not present

## 2018-06-26 DIAGNOSIS — R531 Weakness: Secondary | ICD-10-CM | POA: Diagnosis not present

## 2018-06-27 DIAGNOSIS — R531 Weakness: Secondary | ICD-10-CM | POA: Diagnosis not present

## 2018-06-28 DIAGNOSIS — R531 Weakness: Secondary | ICD-10-CM | POA: Diagnosis not present

## 2018-06-29 DIAGNOSIS — R531 Weakness: Secondary | ICD-10-CM | POA: Diagnosis not present

## 2018-06-30 DIAGNOSIS — R531 Weakness: Secondary | ICD-10-CM | POA: Diagnosis not present

## 2018-07-01 DIAGNOSIS — R531 Weakness: Secondary | ICD-10-CM | POA: Diagnosis not present

## 2018-07-02 DIAGNOSIS — R531 Weakness: Secondary | ICD-10-CM | POA: Diagnosis not present

## 2018-07-03 DIAGNOSIS — R531 Weakness: Secondary | ICD-10-CM | POA: Diagnosis not present

## 2018-07-18 DIAGNOSIS — R531 Weakness: Secondary | ICD-10-CM | POA: Diagnosis not present

## 2018-07-19 DIAGNOSIS — R531 Weakness: Secondary | ICD-10-CM | POA: Diagnosis not present

## 2018-07-20 DIAGNOSIS — R531 Weakness: Secondary | ICD-10-CM | POA: Diagnosis not present

## 2018-07-21 DIAGNOSIS — R531 Weakness: Secondary | ICD-10-CM | POA: Diagnosis not present

## 2018-07-22 DIAGNOSIS — R531 Weakness: Secondary | ICD-10-CM | POA: Diagnosis not present

## 2018-07-23 DIAGNOSIS — R531 Weakness: Secondary | ICD-10-CM | POA: Diagnosis not present

## 2018-07-24 DIAGNOSIS — R531 Weakness: Secondary | ICD-10-CM | POA: Diagnosis not present

## 2018-07-25 DIAGNOSIS — R531 Weakness: Secondary | ICD-10-CM | POA: Diagnosis not present

## 2018-07-26 DIAGNOSIS — R531 Weakness: Secondary | ICD-10-CM | POA: Diagnosis not present

## 2018-07-27 DIAGNOSIS — R531 Weakness: Secondary | ICD-10-CM | POA: Diagnosis not present

## 2018-07-28 DIAGNOSIS — R531 Weakness: Secondary | ICD-10-CM | POA: Diagnosis not present

## 2018-07-29 DIAGNOSIS — R531 Weakness: Secondary | ICD-10-CM | POA: Diagnosis not present

## 2018-07-30 DIAGNOSIS — R531 Weakness: Secondary | ICD-10-CM | POA: Diagnosis not present

## 2018-07-31 DIAGNOSIS — R531 Weakness: Secondary | ICD-10-CM | POA: Diagnosis not present

## 2018-12-26 DIAGNOSIS — Z20828 Contact with and (suspected) exposure to other viral communicable diseases: Secondary | ICD-10-CM | POA: Diagnosis not present

## 2019-09-20 DIAGNOSIS — R531 Weakness: Secondary | ICD-10-CM | POA: Diagnosis not present

## 2019-09-21 DIAGNOSIS — R531 Weakness: Secondary | ICD-10-CM | POA: Diagnosis not present

## 2019-09-22 DIAGNOSIS — R531 Weakness: Secondary | ICD-10-CM | POA: Diagnosis not present

## 2019-09-23 DIAGNOSIS — R531 Weakness: Secondary | ICD-10-CM | POA: Diagnosis not present

## 2019-09-26 DIAGNOSIS — R531 Weakness: Secondary | ICD-10-CM | POA: Diagnosis not present

## 2019-09-27 DIAGNOSIS — R531 Weakness: Secondary | ICD-10-CM | POA: Diagnosis not present

## 2019-09-28 DIAGNOSIS — R531 Weakness: Secondary | ICD-10-CM | POA: Diagnosis not present

## 2019-10-04 DIAGNOSIS — R531 Weakness: Secondary | ICD-10-CM | POA: Diagnosis not present

## 2019-10-05 DIAGNOSIS — R531 Weakness: Secondary | ICD-10-CM | POA: Diagnosis not present

## 2019-10-06 DIAGNOSIS — R531 Weakness: Secondary | ICD-10-CM | POA: Diagnosis not present

## 2019-10-09 DIAGNOSIS — R531 Weakness: Secondary | ICD-10-CM | POA: Diagnosis not present

## 2019-10-10 DIAGNOSIS — R531 Weakness: Secondary | ICD-10-CM | POA: Diagnosis not present

## 2019-10-11 DIAGNOSIS — R531 Weakness: Secondary | ICD-10-CM | POA: Diagnosis not present

## 2019-10-12 DIAGNOSIS — R531 Weakness: Secondary | ICD-10-CM | POA: Diagnosis not present

## 2019-10-13 DIAGNOSIS — R531 Weakness: Secondary | ICD-10-CM | POA: Diagnosis not present

## 2019-10-16 DIAGNOSIS — R531 Weakness: Secondary | ICD-10-CM | POA: Diagnosis not present

## 2019-10-17 DIAGNOSIS — R531 Weakness: Secondary | ICD-10-CM | POA: Diagnosis not present

## 2019-10-18 DIAGNOSIS — R531 Weakness: Secondary | ICD-10-CM | POA: Diagnosis not present

## 2019-10-19 DIAGNOSIS — R531 Weakness: Secondary | ICD-10-CM | POA: Diagnosis not present

## 2019-10-23 DIAGNOSIS — R531 Weakness: Secondary | ICD-10-CM | POA: Diagnosis not present

## 2019-10-24 DIAGNOSIS — R531 Weakness: Secondary | ICD-10-CM | POA: Diagnosis not present

## 2019-10-25 DIAGNOSIS — R531 Weakness: Secondary | ICD-10-CM | POA: Diagnosis not present

## 2019-10-26 DIAGNOSIS — R531 Weakness: Secondary | ICD-10-CM | POA: Diagnosis not present

## 2019-10-27 DIAGNOSIS — R531 Weakness: Secondary | ICD-10-CM | POA: Diagnosis not present

## 2019-10-30 DIAGNOSIS — R531 Weakness: Secondary | ICD-10-CM | POA: Diagnosis not present

## 2019-10-31 DIAGNOSIS — R531 Weakness: Secondary | ICD-10-CM | POA: Diagnosis not present

## 2019-11-01 DIAGNOSIS — R531 Weakness: Secondary | ICD-10-CM | POA: Diagnosis not present

## 2019-11-02 DIAGNOSIS — R531 Weakness: Secondary | ICD-10-CM | POA: Diagnosis not present

## 2019-11-03 DIAGNOSIS — R531 Weakness: Secondary | ICD-10-CM | POA: Diagnosis not present

## 2019-11-06 DIAGNOSIS — R531 Weakness: Secondary | ICD-10-CM | POA: Diagnosis not present

## 2019-11-07 DIAGNOSIS — R531 Weakness: Secondary | ICD-10-CM | POA: Diagnosis not present

## 2019-11-08 DIAGNOSIS — R531 Weakness: Secondary | ICD-10-CM | POA: Diagnosis not present

## 2019-11-09 DIAGNOSIS — R531 Weakness: Secondary | ICD-10-CM | POA: Diagnosis not present

## 2019-11-10 DIAGNOSIS — R531 Weakness: Secondary | ICD-10-CM | POA: Diagnosis not present

## 2019-11-13 DIAGNOSIS — R531 Weakness: Secondary | ICD-10-CM | POA: Diagnosis not present

## 2019-11-14 DIAGNOSIS — R531 Weakness: Secondary | ICD-10-CM | POA: Diagnosis not present

## 2019-11-15 DIAGNOSIS — R531 Weakness: Secondary | ICD-10-CM | POA: Diagnosis not present

## 2019-11-16 DIAGNOSIS — R531 Weakness: Secondary | ICD-10-CM | POA: Diagnosis not present

## 2019-11-20 DIAGNOSIS — R531 Weakness: Secondary | ICD-10-CM | POA: Diagnosis not present

## 2019-11-21 DIAGNOSIS — R531 Weakness: Secondary | ICD-10-CM | POA: Diagnosis not present

## 2019-11-21 DIAGNOSIS — M1990 Unspecified osteoarthritis, unspecified site: Secondary | ICD-10-CM | POA: Diagnosis not present

## 2019-11-21 DIAGNOSIS — I739 Peripheral vascular disease, unspecified: Secondary | ICD-10-CM | POA: Diagnosis not present

## 2019-11-21 DIAGNOSIS — E785 Hyperlipidemia, unspecified: Secondary | ICD-10-CM | POA: Diagnosis not present

## 2019-11-21 DIAGNOSIS — J449 Chronic obstructive pulmonary disease, unspecified: Secondary | ICD-10-CM | POA: Diagnosis not present

## 2019-11-21 DIAGNOSIS — I69354 Hemiplegia and hemiparesis following cerebral infarction affecting left non-dominant side: Secondary | ICD-10-CM | POA: Diagnosis not present

## 2019-11-21 DIAGNOSIS — R269 Unspecified abnormalities of gait and mobility: Secondary | ICD-10-CM | POA: Diagnosis not present

## 2019-11-21 DIAGNOSIS — I10 Essential (primary) hypertension: Secondary | ICD-10-CM | POA: Diagnosis not present

## 2019-11-21 DIAGNOSIS — E039 Hypothyroidism, unspecified: Secondary | ICD-10-CM | POA: Diagnosis not present

## 2019-11-21 DIAGNOSIS — Z7982 Long term (current) use of aspirin: Secondary | ICD-10-CM | POA: Diagnosis not present

## 2019-11-21 DIAGNOSIS — R69 Illness, unspecified: Secondary | ICD-10-CM | POA: Diagnosis not present

## 2019-11-22 DIAGNOSIS — R531 Weakness: Secondary | ICD-10-CM | POA: Diagnosis not present

## 2019-11-23 DIAGNOSIS — R531 Weakness: Secondary | ICD-10-CM | POA: Diagnosis not present

## 2019-11-24 DIAGNOSIS — R531 Weakness: Secondary | ICD-10-CM | POA: Diagnosis not present

## 2019-11-27 DIAGNOSIS — R531 Weakness: Secondary | ICD-10-CM | POA: Diagnosis not present

## 2019-11-28 DIAGNOSIS — R531 Weakness: Secondary | ICD-10-CM | POA: Diagnosis not present

## 2019-11-29 DIAGNOSIS — R531 Weakness: Secondary | ICD-10-CM | POA: Diagnosis not present

## 2019-11-30 DIAGNOSIS — R531 Weakness: Secondary | ICD-10-CM | POA: Diagnosis not present

## 2019-12-01 DIAGNOSIS — R531 Weakness: Secondary | ICD-10-CM | POA: Diagnosis not present

## 2019-12-04 DIAGNOSIS — R531 Weakness: Secondary | ICD-10-CM | POA: Diagnosis not present

## 2019-12-05 ENCOUNTER — Ambulatory Visit: Payer: Medicare HMO | Admitting: Nurse Practitioner

## 2019-12-05 DIAGNOSIS — R531 Weakness: Secondary | ICD-10-CM | POA: Diagnosis not present

## 2019-12-06 DIAGNOSIS — R531 Weakness: Secondary | ICD-10-CM | POA: Diagnosis not present

## 2019-12-07 DIAGNOSIS — R531 Weakness: Secondary | ICD-10-CM | POA: Diagnosis not present

## 2019-12-08 DIAGNOSIS — R531 Weakness: Secondary | ICD-10-CM | POA: Diagnosis not present

## 2019-12-11 DIAGNOSIS — R531 Weakness: Secondary | ICD-10-CM | POA: Diagnosis not present

## 2019-12-12 DIAGNOSIS — R531 Weakness: Secondary | ICD-10-CM | POA: Diagnosis not present

## 2019-12-13 DIAGNOSIS — R531 Weakness: Secondary | ICD-10-CM | POA: Diagnosis not present

## 2019-12-14 DIAGNOSIS — R531 Weakness: Secondary | ICD-10-CM | POA: Diagnosis not present

## 2019-12-18 DIAGNOSIS — R531 Weakness: Secondary | ICD-10-CM | POA: Diagnosis not present

## 2019-12-19 DIAGNOSIS — R531 Weakness: Secondary | ICD-10-CM | POA: Diagnosis not present

## 2019-12-21 DIAGNOSIS — R531 Weakness: Secondary | ICD-10-CM | POA: Diagnosis not present

## 2019-12-25 DIAGNOSIS — R531 Weakness: Secondary | ICD-10-CM | POA: Diagnosis not present

## 2019-12-26 DIAGNOSIS — R531 Weakness: Secondary | ICD-10-CM | POA: Diagnosis not present

## 2019-12-27 DIAGNOSIS — R531 Weakness: Secondary | ICD-10-CM | POA: Diagnosis not present

## 2019-12-28 DIAGNOSIS — R531 Weakness: Secondary | ICD-10-CM | POA: Diagnosis not present

## 2019-12-29 DIAGNOSIS — R531 Weakness: Secondary | ICD-10-CM | POA: Diagnosis not present

## 2020-01-01 DIAGNOSIS — R531 Weakness: Secondary | ICD-10-CM | POA: Diagnosis not present

## 2020-01-02 DIAGNOSIS — R531 Weakness: Secondary | ICD-10-CM | POA: Diagnosis not present

## 2020-01-03 DIAGNOSIS — R531 Weakness: Secondary | ICD-10-CM | POA: Diagnosis not present

## 2020-01-04 DIAGNOSIS — R531 Weakness: Secondary | ICD-10-CM | POA: Diagnosis not present

## 2020-01-05 DIAGNOSIS — R531 Weakness: Secondary | ICD-10-CM | POA: Diagnosis not present

## 2020-01-08 DIAGNOSIS — R531 Weakness: Secondary | ICD-10-CM | POA: Diagnosis not present

## 2020-01-09 DIAGNOSIS — R531 Weakness: Secondary | ICD-10-CM | POA: Diagnosis not present

## 2020-01-10 DIAGNOSIS — R531 Weakness: Secondary | ICD-10-CM | POA: Diagnosis not present

## 2020-01-11 DIAGNOSIS — R531 Weakness: Secondary | ICD-10-CM | POA: Diagnosis not present

## 2020-01-12 DIAGNOSIS — R531 Weakness: Secondary | ICD-10-CM | POA: Diagnosis not present

## 2020-01-17 DIAGNOSIS — R531 Weakness: Secondary | ICD-10-CM | POA: Diagnosis not present

## 2020-01-18 DIAGNOSIS — R531 Weakness: Secondary | ICD-10-CM | POA: Diagnosis not present

## 2020-01-19 DIAGNOSIS — R531 Weakness: Secondary | ICD-10-CM | POA: Diagnosis not present

## 2020-01-22 DIAGNOSIS — R531 Weakness: Secondary | ICD-10-CM | POA: Diagnosis not present

## 2020-01-23 DIAGNOSIS — R531 Weakness: Secondary | ICD-10-CM | POA: Diagnosis not present

## 2020-01-24 DIAGNOSIS — R531 Weakness: Secondary | ICD-10-CM | POA: Diagnosis not present

## 2020-01-25 DIAGNOSIS — R531 Weakness: Secondary | ICD-10-CM | POA: Diagnosis not present

## 2020-01-26 DIAGNOSIS — R531 Weakness: Secondary | ICD-10-CM | POA: Diagnosis not present

## 2020-01-29 DIAGNOSIS — R531 Weakness: Secondary | ICD-10-CM | POA: Diagnosis not present

## 2020-01-30 DIAGNOSIS — R531 Weakness: Secondary | ICD-10-CM | POA: Diagnosis not present

## 2020-01-31 DIAGNOSIS — R531 Weakness: Secondary | ICD-10-CM | POA: Diagnosis not present

## 2020-02-01 DIAGNOSIS — R531 Weakness: Secondary | ICD-10-CM | POA: Diagnosis not present

## 2020-02-06 DIAGNOSIS — R531 Weakness: Secondary | ICD-10-CM | POA: Diagnosis not present

## 2020-02-07 DIAGNOSIS — R531 Weakness: Secondary | ICD-10-CM | POA: Diagnosis not present

## 2020-02-08 DIAGNOSIS — R531 Weakness: Secondary | ICD-10-CM | POA: Diagnosis not present

## 2020-02-09 DIAGNOSIS — R531 Weakness: Secondary | ICD-10-CM | POA: Diagnosis not present

## 2020-02-12 DIAGNOSIS — R531 Weakness: Secondary | ICD-10-CM | POA: Diagnosis not present

## 2020-02-13 DIAGNOSIS — R531 Weakness: Secondary | ICD-10-CM | POA: Diagnosis not present

## 2020-02-14 DIAGNOSIS — R531 Weakness: Secondary | ICD-10-CM | POA: Diagnosis not present

## 2020-02-15 DIAGNOSIS — R531 Weakness: Secondary | ICD-10-CM | POA: Diagnosis not present

## 2020-02-16 DIAGNOSIS — R531 Weakness: Secondary | ICD-10-CM | POA: Diagnosis not present

## 2020-02-19 ENCOUNTER — Ambulatory Visit: Payer: Medicare HMO | Attending: Nurse Practitioner | Admitting: Nurse Practitioner

## 2020-02-19 ENCOUNTER — Other Ambulatory Visit: Payer: Self-pay

## 2020-02-19 ENCOUNTER — Other Ambulatory Visit: Payer: Self-pay | Admitting: Nurse Practitioner

## 2020-02-19 ENCOUNTER — Encounter: Payer: Self-pay | Admitting: Nurse Practitioner

## 2020-02-19 VITALS — Ht 66.0 in | Wt 142.0 lb

## 2020-02-19 DIAGNOSIS — I639 Cerebral infarction, unspecified: Secondary | ICD-10-CM

## 2020-02-19 DIAGNOSIS — I10 Essential (primary) hypertension: Secondary | ICD-10-CM

## 2020-02-19 DIAGNOSIS — R531 Weakness: Secondary | ICD-10-CM | POA: Diagnosis not present

## 2020-02-19 DIAGNOSIS — E039 Hypothyroidism, unspecified: Secondary | ICD-10-CM

## 2020-02-19 DIAGNOSIS — E559 Vitamin D deficiency, unspecified: Secondary | ICD-10-CM

## 2020-02-19 DIAGNOSIS — Z1231 Encounter for screening mammogram for malignant neoplasm of breast: Secondary | ICD-10-CM | POA: Diagnosis not present

## 2020-02-19 DIAGNOSIS — K219 Gastro-esophageal reflux disease without esophagitis: Secondary | ICD-10-CM

## 2020-02-19 DIAGNOSIS — D649 Anemia, unspecified: Secondary | ICD-10-CM

## 2020-02-19 DIAGNOSIS — R7303 Prediabetes: Secondary | ICD-10-CM

## 2020-02-19 DIAGNOSIS — E785 Hyperlipidemia, unspecified: Secondary | ICD-10-CM | POA: Diagnosis not present

## 2020-02-19 MED ORDER — ASPIRIN EC 81 MG PO TBEC
81.0000 mg | DELAYED_RELEASE_TABLET | Freq: Every day | ORAL | 3 refills | Status: AC
Start: 2020-02-19 — End: 2020-05-19

## 2020-02-19 MED ORDER — SPIRONOLACTONE 25 MG PO TABS
25.0000 mg | ORAL_TABLET | Freq: Every day | ORAL | 1 refills | Status: DC
Start: 2020-02-19 — End: 2020-02-21

## 2020-02-19 MED ORDER — METOPROLOL TARTRATE 25 MG PO TABS: 25.0000 mg | ORAL_TABLET | Freq: Two times a day (BID) | ORAL | 1 refills | Status: DC

## 2020-02-19 MED ORDER — LEVOTHYROXINE SODIUM 50 MCG PO TABS: 50.0000 ug | ORAL_TABLET | Freq: Every day | ORAL | 1 refills | Status: DC

## 2020-02-19 MED ORDER — ROSUVASTATIN CALCIUM 5 MG PO TABS
5.0000 mg | ORAL_TABLET | Freq: Every day | ORAL | 2 refills | Status: AC
Start: 2020-02-19 — End: 2020-07-01

## 2020-02-19 MED ORDER — GABAPENTIN 600 MG PO TABS: 600.0000 mg | ORAL_TABLET | Freq: Three times a day (TID) | ORAL | 0 refills | Status: DC

## 2020-02-19 MED ORDER — VITAMIN D (ERGOCALCIFEROL) 1.25 MG (50000 UNIT) PO CAPS: 50000.0000 [IU] | ORAL_CAPSULE | ORAL | 1 refills | Status: DC

## 2020-02-19 MED ORDER — LISINOPRIL 10 MG PO TABS: 10.0000 mg | ORAL_TABLET | Freq: Every day | ORAL | 1 refills | Status: DC

## 2020-02-19 MED ORDER — AMLODIPINE BESYLATE 5 MG PO TABS: 5.0000 mg | ORAL_TABLET | Freq: Every day | ORAL | 1 refills | Status: DC

## 2020-02-19 MED ORDER — FAMOTIDINE 20 MG PO TABS
20.0000 mg | ORAL_TABLET | Freq: Every day | ORAL | 1 refills | Status: AC
Start: 2020-02-19 — End: 2020-07-01

## 2020-02-19 NOTE — Telephone Encounter (Signed)
Requested medications are due for refill today.  yes  Requested medications are on the active medications list.  yes  Last refill. 02/19/2020  Future visit scheduled.     Notes to clinic. Pt is requesting a 90 day supply of these meds.

## 2020-02-19 NOTE — Progress Notes (Signed)
Virtual Visit via Telephone Note Due to national recommendations of social distancing due to [place] 19, telehealth visit is felt to be most appropriate for this patient at this time.  I discussed the limitations, risks, security and privacy concerns of performing an evaluation and management service by telephone and the availability of in person appointments. I also discussed with the patient that there may be a patient responsible charge related to this service. The patient expressed understanding and agreed to proceed.    I connected with Jordan Bass on 02/19/20  at   8:50 AM EST  EDT by telephone and verified that I am speaking with the correct person using two identifiers.   Consent I discussed the limitations, risks, security and privacy concerns of performing an evaluation and management service by telephone and the availability of in person appointments. I also discussed with the patient that there may be a patient responsible charge related to this service. The patient expressed understanding and agreed to proceed.   Location of Patient: Private Residence   Location of Provider: [place] [facility] [employer] Office    Persons participating in Telemedicine visit: Geryl Rankins FNP-BC [place]    History of Present Illness: Telemedicine visit for: Establish Care She has a past medical history of Anemia, OA,  Bilateral calcaneal spurs, Hyperlipidemia, Hypertension, Stroke (2016), Thyroid disease, and left sided residual Weakness.   Patient has been counseled on age-appropriate routine health concerns for screening and prevention. These are reviewed and up-to-date. Referrals have been placed accordingly. Immunizations are up-to-date or declined.    Mammogram: Overdue by 2 years PAP SMEAR: Overdue Colonoscopy: 2 years ago. She did have polyps removed.   She has not been under the care of a PCP in quite some time.  Essential Hypertension States  home systolic blood pressure readings: 90s. She does monitor her blood pressure at home several times a week.  Denies chest pain, shortness of breath, palpitations, lightheadedness, dizziness, headaches or BLE edema. She is currently taking amlodipine 5 mg daily,  BP Readings from Last 3 Encounters:  10/07/17 112/77  09/14/17 110/61  08/11/17 122/82   Hypothyroidism She has been prescribed Synthroid however she cannot recall actual dosage.  Current symptoms include "Voice coming and going, feeling anxious a lot" and unintentional weight loss with decreased appetite.  She states her decreased appetite started after her last Covid vaccine.  She ran out of thyroid medication last year.  Lab Results  Component Value Date   TSH 2.05 08/11/2017   Depression screen PHQ 2/9 02/19/2020  Decreased Interest 0  Down, Depressed, Hopeless 0  PHQ - 2 Score 0    Tobacco Dependence Started smoking at 65 years of age. Smokes 6-8 cigarettes per day. Will need LUNG CT per protocol.   CVA S/P Stroke in 2016 with residual left sided weakness and neuropathy for which she takes Neurontin.  Notes numbness in 4 fingers of her left hand  Past Medical History:  Diagnosis Date  . Anemia   . Arthritis   . Bilateral calcaneal spurs    heel spurs  . Hyperlipidemia   . Hypertension   . Stroke ([place]) 2016   weakness on left side/uses cane/in coma for 45 days  . Thyroid disease   . Weakness    left side weakness due to stroke/uses cane    Past Surgical History:  Procedure Laterality Date  . COLONOSCOPY     over 10 years ago in [place]  .  HAND SURGERY  1987   right hand surgery due to caught in bacon machine/ use ligament from stomach to repair your hand    Family History  Problem Relation Age of Onset  . Arthritis Mother   . Cancer Mother   . Diabetes Mother   . Heart disease Mother   . Hyperlipidemia Mother   . Hypertension Mother   . Stroke Father   . Cancer Father   . Arthritis Father   .  Heart disease Sister   . Arthritis Brother   . Hypertension Brother   . Healthy Sister   . Hypertension Brother   . Hypertension Brother   . Hypertension Brother   . Pneumonia Sister   . Pneumonia Sister   . Pneumonia Brother   . Pneumonia Brother   . Heart disease Brother   . AAA (abdominal aortic aneurysm) Brother   . Colon polyps Neg Hx   . Esophageal cancer Neg Hx   . Rectal cancer Neg Hx   . Stomach cancer Neg Hx     Social History   Socioeconomic History  . Marital status: Widowed    Spouse name: Not on file  . Number of children: Not on file  . Years of education: Not on file  . Highest education level: 12th grade  Occupational History  . Not on file  Tobacco Use  . Smoking status: Current Every Day Smoker    Packs/day: 0.33    Years: 40.00    Pack years: 13.20    Types: Cigarettes  . Smokeless tobacco: Never Used  Vaping Use  . Vaping Use: Unknown  Substance and Sexual Activity  . Alcohol use: Yes    Alcohol/week: 7.0 standard drinks    Types: 7 Cans of beer per week    Comment: beer daily  . Drug use: Never  . Sexual activity: Not Currently  Other Topics Concern  . Not on file  Social History Narrative  . Not on file   Social Determinants of Health   Financial Resource Strain: Not on file  Food Insecurity: Not on file  Transportation Needs: Not on file  Physical Activity: Not on file  Stress: Not on file  Social Connections: Not on file     Observations/Objective: Awake, alert and oriented x 3   Review of Systems  Constitutional: Negative for fever, malaise/fatigue and weight loss.  HENT: Negative.  Negative for nosebleeds.   Eyes: Negative.  Negative for blurred vision, double vision and photophobia.  Respiratory: Negative.  Negative for cough and shortness of breath.   Cardiovascular: Negative.  Negative for chest pain, palpitations and leg swelling.  Gastrointestinal: Negative.  Negative for heartburn, nausea and vomiting.   Musculoskeletal: Negative.  Negative for myalgias.  Neurological: Positive for sensory change and weakness. Negative for dizziness, focal weakness, seizures and headaches.  Psychiatric/Behavioral: Negative for suicidal ideas. The patient is nervous/anxious.     Assessment and Plan: Jordan Bass was seen today for establish care.  Diagnoses and all orders for this visit:  Essential hypertension -     amLODipine (NORVASC) 5 MG tablet; Take 1 tablet (5 mg total) by mouth daily. -     lisinopril (ZESTRIL) 10 MG tablet; Take 1 tablet (10 mg total) by mouth daily. -     metoprolol tartrate (LOPRESSOR) 25 MG tablet; Take 1 tablet (25 mg total) by mouth 2 (two) times daily. -     spironolactone (ALDACTONE) 25 MG tablet; Take 1 tablet (25 mg total) by mouth  daily. -     CMP14+EGFR; Future Continue all antihypertensives as prescribed.  Remember to bring in your blood pressure log with you for your follow up appointment.  DASH/Mediterranean Diets are healthier choices for HTN.    Acquired hypothyroidism -     levothyroxine (SYNTHROID) 50 MCG tablet; Take 1 tablet (50 mcg total) by mouth daily before breakfast. -     TSH; Future  Vitamin D deficiency disease -     Vitamin D, Ergocalciferol, (DRISDOL) 1.25 MG (50000 UNIT) CAPS capsule; Take 1 capsule (50,000 Units total) by mouth every 7 (seven) days. ONCE A WEEK NOT DAILY!!! -     VITAMIN D 25 Hydroxy (Vit-D Deficiency, Fractures); Future  Dyslipidemia, goal LDL below 100 -     rosuvastatin (CRESTOR) 5 MG tablet; Take 1 tablet (5 mg total) by mouth daily. -     Lipid panel; Future INSTRUCTIONS: Work on a low fat, heart healthy diet and participate in regular aerobic exercise program by working out at least 150 minutes per week; 5 days a week-30 minutes per day. Avoid red meat/beef/steak,  fried foods. junk foods, sodas, sugary drinks, unhealthy snacking, alcohol and smoking.  Drink at least 80 oz of water per day and monitor your carbohydrate intake  daily.    Breast cancer screening by mammogram -     MM DIGITAL SCREENING BILATERAL; Future  Anemia, unspecified type -     CBC; Future  Prediabetes -     Hemoglobin A1c; Future Continue blood sugar control as discussed in office today, low carbohydrate diet, and regular physical exercise as tolerated, 150 minutes per week (30 min each day, 5 days per week, or 50 min 3 days per week).    GERD without esophagitis -     famotidine (PEPCID) 20 MG tablet; Take 1 tablet (20 mg total) by mouth daily. INSTRUCTIONS: Avoid GERD Triggers: acidic, spicy or fried foods, caffeine, coffee, sodas,  alcohol and chocolate.    Cerebrovascular accident (CVA), unspecified mechanism ([place]) -     gabapentin (NEURONTIN) 600 MG tablet; Take 1 tablet (600 mg total) by mouth 3 (three) times daily. -     aspirin EC 81 MG tablet; Take 1 tablet (81 mg total) by mouth daily.     Follow Up Instructions Return in about 8 weeks (around 04/15/2020).     I discussed the assessment and treatment plan with the patient. The patient was provided an opportunity to ask questions and all were answered. The patient agreed with the plan and demonstrated an understanding of the instructions.   The patient was advised to call back or seek an in-person evaluation if the symptoms worsen or if the condition fails to improve as anticipated.  I provided 22 minutes of non-face-to-face time during this encounter including median intraservice time, reviewing previous notes, labs, imaging, medications and explaining diagnosis and management.  Gildardo Pounds, FNP-BC

## 2020-02-20 DIAGNOSIS — R531 Weakness: Secondary | ICD-10-CM | POA: Diagnosis not present

## 2020-02-21 DIAGNOSIS — R531 Weakness: Secondary | ICD-10-CM | POA: Diagnosis not present

## 2020-02-22 ENCOUNTER — Ambulatory Visit: Payer: Medicare HMO | Attending: Nurse Practitioner

## 2020-02-22 ENCOUNTER — Other Ambulatory Visit: Payer: Self-pay

## 2020-02-22 DIAGNOSIS — D649 Anemia, unspecified: Secondary | ICD-10-CM | POA: Diagnosis not present

## 2020-02-22 DIAGNOSIS — R531 Weakness: Secondary | ICD-10-CM | POA: Diagnosis not present

## 2020-02-22 DIAGNOSIS — E785 Hyperlipidemia, unspecified: Secondary | ICD-10-CM | POA: Diagnosis not present

## 2020-02-22 DIAGNOSIS — E039 Hypothyroidism, unspecified: Secondary | ICD-10-CM

## 2020-02-22 DIAGNOSIS — R7303 Prediabetes: Secondary | ICD-10-CM

## 2020-02-22 DIAGNOSIS — E559 Vitamin D deficiency, unspecified: Secondary | ICD-10-CM

## 2020-02-22 DIAGNOSIS — I10 Essential (primary) hypertension: Secondary | ICD-10-CM | POA: Diagnosis not present

## 2020-02-23 LAB — CMP14+EGFR
ALT: 58 IU/L — ABNORMAL HIGH (ref 0–32)
AST: 64 IU/L — ABNORMAL HIGH (ref 0–40)
Albumin/Globulin Ratio: 2 (ref 1.2–2.2)
Albumin: 4.7 g/dL (ref 3.8–4.8)
Alkaline Phosphatase: 129 IU/L — ABNORMAL HIGH (ref 44–121)
BUN/Creatinine Ratio: 12 (ref 12–28)
BUN: 9 mg/dL (ref 8–27)
Bilirubin Total: 0.4 mg/dL (ref 0.0–1.2)
CO2: 22 mmol/L (ref 20–29)
Calcium: 9.9 mg/dL (ref 8.7–10.3)
Chloride: 97 mmol/L (ref 96–106)
Creatinine, Ser: 0.75 mg/dL (ref 0.57–1.00)
GFR calc Af Amer: 99 mL/min/{1.73_m2} (ref >59)
GFR calc non Af Amer: 86 mL/min/{1.73_m2} (ref >59)
Globulin, Total: 2.4 g/dL (ref 1.5–4.5)
Glucose: 146 mg/dL — ABNORMAL HIGH (ref 65–99)
Potassium: 4.1 mmol/L (ref 3.5–5.2)
Sodium: 138 mmol/L (ref 134–144)
Total Protein: 7.1 g/dL (ref 6.0–8.5)

## 2020-02-23 LAB — CBC
Hematocrit: 40.9 % (ref 34.0–46.6)
Hemoglobin: 13.8 g/dL (ref 11.1–15.9)
MCH: 29.1 pg (ref 26.6–33.0)
MCHC: 33.7 g/dL (ref 31.5–35.7)
MCV: 86 fL (ref 79–97)
Platelets: 212 10*3/uL (ref 150–450)
RBC: 4.75 x10E6/uL (ref 3.77–5.28)
RDW: 12.7 % (ref 11.7–15.4)
WBC: 5 10*3/uL (ref 3.4–10.8)

## 2020-02-23 LAB — LIPID PANEL
Chol/HDL Ratio: 2.2 ratio (ref 0.0–4.4)
Cholesterol, Total: 227 mg/dL — ABNORMAL HIGH (ref 100–199)
HDL: 104 mg/dL (ref >39)
LDL Chol Calc (NIH): 111 mg/dL — ABNORMAL HIGH (ref 0–99)
Triglycerides: 70 mg/dL (ref 0–149)
VLDL Cholesterol Cal: 12 mg/dL (ref 5–40)

## 2020-02-23 LAB — HEMOGLOBIN A1C
Est. average glucose Bld gHb Est-mCnc: 131 mg/dL
Hgb A1c MFr Bld: 6.2 % — ABNORMAL HIGH (ref 4.8–5.6)

## 2020-02-23 LAB — VITAMIN D 25 HYDROXY (VIT D DEFICIENCY, FRACTURES): Vit D, 25-Hydroxy: 31.7 ng/mL (ref 30.0–100.0)

## 2020-02-23 LAB — TSH: TSH: 3.26 u[IU]/mL (ref 0.450–4.500)

## 2020-02-26 DIAGNOSIS — R531 Weakness: Secondary | ICD-10-CM | POA: Diagnosis not present

## 2020-02-27 DIAGNOSIS — R531 Weakness: Secondary | ICD-10-CM | POA: Diagnosis not present

## 2020-02-28 DIAGNOSIS — R531 Weakness: Secondary | ICD-10-CM | POA: Diagnosis not present

## 2020-02-29 DIAGNOSIS — R531 Weakness: Secondary | ICD-10-CM | POA: Diagnosis not present

## 2020-03-01 DIAGNOSIS — R531 Weakness: Secondary | ICD-10-CM | POA: Diagnosis not present

## 2020-03-04 DIAGNOSIS — R531 Weakness: Secondary | ICD-10-CM | POA: Diagnosis not present

## 2020-03-05 DIAGNOSIS — R531 Weakness: Secondary | ICD-10-CM | POA: Diagnosis not present

## 2020-03-06 DIAGNOSIS — R531 Weakness: Secondary | ICD-10-CM | POA: Diagnosis not present

## 2020-03-07 DIAGNOSIS — R531 Weakness: Secondary | ICD-10-CM | POA: Diagnosis not present

## 2020-03-08 DIAGNOSIS — R531 Weakness: Secondary | ICD-10-CM | POA: Diagnosis not present

## 2020-03-11 DIAGNOSIS — R531 Weakness: Secondary | ICD-10-CM | POA: Diagnosis not present

## 2020-03-12 DIAGNOSIS — R531 Weakness: Secondary | ICD-10-CM | POA: Diagnosis not present

## 2020-03-13 DIAGNOSIS — R531 Weakness: Secondary | ICD-10-CM | POA: Diagnosis not present

## 2020-03-14 DIAGNOSIS — R531 Weakness: Secondary | ICD-10-CM | POA: Diagnosis not present

## 2020-03-15 DIAGNOSIS — R531 Weakness: Secondary | ICD-10-CM | POA: Diagnosis not present

## 2020-03-18 ENCOUNTER — Other Ambulatory Visit: Payer: Self-pay

## 2020-03-18 ENCOUNTER — Encounter (HOSPITAL_COMMUNITY): Payer: Self-pay | Admitting: Emergency Medicine

## 2020-03-18 ENCOUNTER — Emergency Department (HOSPITAL_COMMUNITY): Payer: Medicare HMO

## 2020-03-18 ENCOUNTER — Inpatient Hospital Stay (HOSPITAL_COMMUNITY)
Admission: EM | Admit: 2020-03-18 | Discharge: 2020-03-21 | DRG: 439 | Disposition: A | Discharge status: Home / Self Care | Payer: Medicare HMO | Attending: Family Medicine | Admitting: Family Medicine

## 2020-03-18 DIAGNOSIS — R109 Unspecified abdominal pain: Secondary | ICD-10-CM

## 2020-03-18 DIAGNOSIS — Z833 Family history of diabetes mellitus: Secondary | ICD-10-CM

## 2020-03-18 DIAGNOSIS — Z823 Family history of stroke: Secondary | ICD-10-CM

## 2020-03-18 DIAGNOSIS — Z8249 Family history of ischemic heart disease and other diseases of the circulatory system: Secondary | ICD-10-CM

## 2020-03-18 DIAGNOSIS — K760 Fatty (change of) liver, not elsewhere classified: Secondary | ICD-10-CM | POA: Diagnosis not present

## 2020-03-18 DIAGNOSIS — F32A Depression, unspecified: Secondary | ICD-10-CM | POA: Diagnosis present

## 2020-03-18 DIAGNOSIS — I69354 Hemiplegia and hemiparesis following cerebral infarction affecting left non-dominant side: Secondary | ICD-10-CM

## 2020-03-18 DIAGNOSIS — F1010 Alcohol abuse, uncomplicated: Secondary | ICD-10-CM | POA: Diagnosis present

## 2020-03-18 DIAGNOSIS — E785 Hyperlipidemia, unspecified: Secondary | ICD-10-CM | POA: Diagnosis present

## 2020-03-18 DIAGNOSIS — Z83438 Family history of other disorder of lipoprotein metabolism and other lipidemia: Secondary | ICD-10-CM

## 2020-03-18 DIAGNOSIS — R7989 Other specified abnormal findings of blood chemistry: Secondary | ICD-10-CM | POA: Diagnosis present

## 2020-03-18 DIAGNOSIS — M1990 Unspecified osteoarthritis, unspecified site: Secondary | ICD-10-CM | POA: Diagnosis present

## 2020-03-18 DIAGNOSIS — I77811 Abdominal aortic ectasia: Secondary | ICD-10-CM | POA: Diagnosis present

## 2020-03-18 DIAGNOSIS — E039 Hypothyroidism, unspecified: Secondary | ICD-10-CM | POA: Diagnosis present

## 2020-03-18 DIAGNOSIS — K859 Acute pancreatitis without necrosis or infection, unspecified: Secondary | ICD-10-CM | POA: Diagnosis not present

## 2020-03-18 DIAGNOSIS — E871 Hypo-osmolality and hyponatremia: Secondary | ICD-10-CM | POA: Diagnosis present

## 2020-03-18 DIAGNOSIS — K863 Pseudocyst of pancreas: Secondary | ICD-10-CM | POA: Diagnosis present

## 2020-03-18 DIAGNOSIS — Z79890 Hormone replacement therapy (postmenopausal): Secondary | ICD-10-CM

## 2020-03-18 DIAGNOSIS — Z7982 Long term (current) use of aspirin: Secondary | ICD-10-CM

## 2020-03-18 DIAGNOSIS — Z888 Allergy status to other drugs, medicaments and biological substances status: Secondary | ICD-10-CM

## 2020-03-18 DIAGNOSIS — Z8261 Family history of arthritis: Secondary | ICD-10-CM

## 2020-03-18 DIAGNOSIS — E559 Vitamin D deficiency, unspecified: Secondary | ICD-10-CM

## 2020-03-18 DIAGNOSIS — R531 Weakness: Secondary | ICD-10-CM | POA: Diagnosis not present

## 2020-03-18 DIAGNOSIS — I10 Essential (primary) hypertension: Secondary | ICD-10-CM | POA: Diagnosis present

## 2020-03-18 DIAGNOSIS — Z20822 Contact with and (suspected) exposure to covid-19: Secondary | ICD-10-CM | POA: Diagnosis present

## 2020-03-18 DIAGNOSIS — K852 Alcohol induced acute pancreatitis without necrosis or infection: Principal | ICD-10-CM | POA: Diagnosis present

## 2020-03-18 DIAGNOSIS — Z79899 Other long term (current) drug therapy: Secondary | ICD-10-CM

## 2020-03-18 DIAGNOSIS — D649 Anemia, unspecified: Secondary | ICD-10-CM | POA: Diagnosis present

## 2020-03-18 DIAGNOSIS — E860 Dehydration: Secondary | ICD-10-CM | POA: Diagnosis present

## 2020-03-18 DIAGNOSIS — F17210 Nicotine dependence, cigarettes, uncomplicated: Secondary | ICD-10-CM | POA: Diagnosis present

## 2020-03-18 HISTORY — DX: Unspecified abdominal pain: R10.9

## 2020-03-18 LAB — COMPREHENSIVE METABOLIC PANEL
ALT: 98 U/L — ABNORMAL HIGH (ref 0–44)
AST: 40 U/L (ref 15–41)
Albumin: 3.7 g/dL (ref 3.5–5.0)
Alkaline Phosphatase: 90 U/L (ref 38–126)
Anion gap: 12 (ref 5–15)
BUN: <5 mg/dL — ABNORMAL LOW (ref 8–23)
CO2: 22 mmol/L (ref 22–32)
Calcium: 9.4 mg/dL (ref 8.9–10.3)
Chloride: 89 mmol/L — ABNORMAL LOW (ref 98–111)
Creatinine, Ser: 0.66 mg/dL (ref 0.44–1.00)
GFR, Estimated: >60 mL/min (ref >60)
Glucose, Bld: 168 mg/dL — ABNORMAL HIGH (ref 70–99)
Potassium: 3.8 mmol/L (ref 3.5–5.1)
Sodium: 123 mmol/L — ABNORMAL LOW (ref 135–145)
Total Bilirubin: 0.7 mg/dL (ref 0.3–1.2)
Total Protein: 6.8 g/dL (ref 6.5–8.1)

## 2020-03-18 LAB — CBC
HCT: 41.8 % (ref 36.0–46.0)
Hemoglobin: 14.1 g/dL (ref 12.0–15.0)
MCH: 28.7 pg (ref 26.0–34.0)
MCHC: 33.7 g/dL (ref 30.0–36.0)
MCV: 85.1 fL (ref 80.0–100.0)
Platelets: 266 10*3/uL (ref 150–400)
RBC: 4.91 MIL/uL (ref 3.87–5.11)
RDW: 13.2 % (ref 11.5–15.5)
WBC: 5.5 10*3/uL (ref 4.0–10.5)
nRBC: 0 % (ref 0.0–0.2)

## 2020-03-18 LAB — URINALYSIS, ROUTINE W REFLEX MICROSCOPIC
Bilirubin Urine: NEGATIVE
Glucose, UA: NEGATIVE mg/dL
Hgb urine dipstick: NEGATIVE
Ketones, ur: 5 mg/dL — AB
Nitrite: POSITIVE — AB
Protein, ur: NEGATIVE mg/dL
Specific Gravity, Urine: 1.011 (ref 1.005–1.030)
pH: 5 (ref 5.0–8.0)

## 2020-03-18 LAB — LIPASE, BLOOD: Lipase: 165 U/L — ABNORMAL HIGH (ref 11–51)

## 2020-03-18 MED ORDER — HYDROMORPHONE HCL 1 MG/ML IJ SOLN
1.0000 mg | Freq: Once | INTRAMUSCULAR | Status: AC
  Administered 2020-03-18: 1 mg via INTRAVENOUS
  Filled 2020-03-18: qty 1

## 2020-03-18 MED ORDER — SODIUM CHLORIDE 0.9 % IV BOLUS
1000.0000 mL | Freq: Once | INTRAVENOUS | Status: AC
  Administered 2020-03-18: 1000 mL via INTRAVENOUS

## 2020-03-18 MED ORDER — ONDANSETRON HCL 4 MG/2ML IJ SOLN
4.0000 mg | Freq: Once | INTRAMUSCULAR | Status: AC
  Administered 2020-03-18: 4 mg via INTRAVENOUS
  Filled 2020-03-18: qty 2

## 2020-03-18 MED ORDER — IOHEXOL 300 MG/ML  SOLN
100.0000 mL | Freq: Once | INTRAMUSCULAR | Status: AC | PRN
  Administered 2020-03-18: 100 mL via INTRAVENOUS

## 2020-03-18 NOTE — ED Notes (Signed)
Pt to CT

## 2020-03-18 NOTE — ED Triage Notes (Signed)
Pt. Stated, Jordan Bass been having abdominal pain with N/V for a week.

## 2020-03-18 NOTE — ED Provider Notes (Signed)
[facility] EMERGENCY DEPARTMENT Provider Note   CSN: [id number] Arrival date & time: 03/18/20  4098     History Chief Complaint  Patient presents with   Abdominal Pain   Nausea   Emesis    Jordan Bass is a 65 y.o. female.  Patient presents to the emergency department with a chief complaint of abdominal pain.  She reports associated nausea, vomiting, and diarrhea.  She states that she has been having the symptoms for about a week.  She denies fevers or chills.  Denies dysuria, hematuria, or vaginal discharge.  She denies any successful treatments prior to arrival.  She rates her abdominal pain is moderate.  She denies any alcohol or drug use.  The history is provided by the patient. No language interpreter was used.       Past Medical History:  Diagnosis Date   Anemia    Arthritis    Bilateral calcaneal spurs    heel spurs   Hyperlipidemia    Hypertension    Stroke ([place]) 2016   weakness on left side/uses cane/in coma for 45 days   Thyroid disease    Weakness    left side weakness due to stroke/uses cane    Patient Active Problem List   Diagnosis Date Noted   Arthritis 08/11/2017   Depression 08/11/2017   High blood pressure 08/11/2017   Stroke (cerebrum) ([place]) 08/11/2017   Chicken pox 08/11/2017    Past Surgical History:  Procedure Laterality Date   COLONOSCOPY     over 10 years ago in [place]   right hand surgery due to caught in bacon machine/ use ligament from stomach to repair your hand     OB History   No obstetric history on file.     Family History  Problem Relation Age of Onset   Arthritis Mother    Cancer Mother    Diabetes Mother    Heart disease Mother    Hyperlipidemia Mother    Hypertension Mother    Stroke Father    Cancer Father    Arthritis Father    Heart disease Sister    Arthritis Brother    Hypertension Brother    Healthy Sister     Hypertension Brother    Hypertension Brother    Hypertension Brother    Pneumonia Sister    Pneumonia Sister    Pneumonia Brother    Pneumonia Brother    Heart disease Brother    AAA (abdominal aortic aneurysm) Brother    Colon polyps Neg Hx    Esophageal cancer Neg Hx    Rectal cancer Neg Hx    Stomach cancer Neg Hx     Social History   Tobacco Use   Smoking status: Current Every Day Smoker    Packs/day: 0.33    Years: 40.00    Pack years: 13.20    Types: Cigarettes   Smokeless tobacco: Never Used  Scientific laboratory technician Use: Unknown  Substance Use Topics   Alcohol use: Yes    Alcohol/week: 7.0 standard drinks    Types: 7 Cans of beer per week    Comment: beer daily   Drug use: Never    Home Medications Prior to Admission medications   Medication Sig Start Date End Date Taking? Authorizing Provider  amLODipine (NORVASC) 5 MG tablet TAKE 1 TABLET(5 MG) BY MOUTH DAILY 02/21/20   Charlott Rakes, MD  levothyroxine (SYNTHROID) 50 MCG tablet TAKE  1 TABLET(50 MCG) BY MOUTH DAILY BEFORE BREAKFAST 02/21/20   Charlott Rakes, MD  lisinopril (ZESTRIL) 10 MG tablet TAKE 1 TABLET(10 MG) BY MOUTH DAILY 02/21/20   Charlott Rakes, MD  spironolactone (ALDACTONE) 25 MG tablet TAKE 1 TABLET(25 MG) BY MOUTH DAILY 02/21/20   Charlott Rakes, MD  aspirin EC 81 MG tablet Take 1 tablet (81 mg total) by mouth daily. 02/19/20 05/19/20  Gildardo Pounds, NP  Cholecalciferol (VITAMIN D3) 1000 units CAPS Take by mouth.    [provider]  famotidine (PEPCID) 20 MG tablet Take 1 tablet (20 mg total) by mouth daily. 02/19/20 05/19/20  Gildardo Pounds, NP  gabapentin (NEURONTIN) 600 MG tablet Take 1 tablet (600 mg total) by mouth 3 (three) times daily. 02/19/20   Gildardo Pounds, NP  metoprolol tartrate (LOPRESSOR) 25 MG tablet Take 1 tablet (25 mg total) by mouth 2 (two) times daily. 02/19/20   Gildardo Pounds, NP  rosuvastatin (CRESTOR) 5 MG tablet Take 1 tablet (5 mg total) by mouth  daily. 02/19/20 05/19/20  Gildardo Pounds, NP  Vitamin D, Ergocalciferol, (DRISDOL) 1.25 MG (50000 UNIT) CAPS capsule Take 1 capsule (50,000 Units total) by mouth every 7 (seven) days. ONCE A WEEK NOT DAILY!!! 02/19/20   Gildardo Pounds, NP    Allergies    Seasonal ic [cholestatin]  Review of Systems   Review of Systems  All other systems reviewed and are negative.   Physical Exam Updated Vital Signs BP (!) 144/74    Pulse 87    Temp 97.8 F (36.6 C) (Oral)    Resp 16    SpO2 100%   Physical Exam Vitals and nursing note reviewed.  Constitutional:      General: She is not in acute distress.    Appearance: She is well-developed and well-nourished.  HENT:     Head: Normocephalic and atraumatic.  Eyes:     Conjunctiva/sclera: Conjunctivae normal.  Cardiovascular:     Rate and Rhythm: Normal rate and regular rhythm.     Heart sounds: No murmur heard.   Pulmonary:     Effort: Pulmonary effort is normal. No respiratory distress.     Breath sounds: Normal breath sounds.  Abdominal:     Palpations: Abdomen is soft.     Tenderness: There is no abdominal tenderness.  Musculoskeletal:        General: No edema. Normal range of motion.     Cervical back: Neck supple.  Skin:    General: Skin is warm and dry.  Neurological:     Mental Status: She is alert and oriented to person, place, and time.  Psychiatric:        Mood and Affect: Mood and affect and mood normal.        Behavior: Behavior normal.     ED Results / Procedures / Treatments   Labs (all labs ordered are listed, but only abnormal results are displayed) Labs Reviewed  LIPASE, BLOOD - Abnormal; Notable for the following components:      Result Value   Lipase 165 (*)    All other components within normal limits  COMPREHENSIVE METABOLIC PANEL - Abnormal; Notable for the following components:   Sodium 123 (*)    Chloride 89 (*)    Glucose, Bld 168 (*)    BUN <5 (*)    ALT 98 (*)    All other components within  normal limits  URINALYSIS, ROUTINE W REFLEX MICROSCOPIC - Abnormal; Notable for the  following components:   Color, Urine AMBER (*)    APPearance CLOUDY (*)    Ketones, ur 5 (*)    Nitrite POSITIVE (*)    Leukocytes,Ua TRACE (*)    Bacteria, UA FEW (*)    All other components within normal limits  SARS CORONAVIRUS 2 (TAT 6-24 HRS)  CBC    EKG None  Radiology No results found.  Procedures Procedures   Medications Ordered in ED Medications  sodium chloride 0.9 % bolus 1,000 mL (1,000 mLs Intravenous New Bag/Given 03/18/20 2326)  HYDROmorphone (DILAUDID) injection 1 mg (1 mg Intravenous Given 03/18/20 2328)  ondansetron (ZOFRAN) injection 4 mg (4 mg Intravenous Given 03/18/20 2326)  iohexol (OMNIPAQUE) 300 MG/ML solution 100 mL (100 mLs Intravenous Contrast Given 03/18/20 2340)    ED Course  I have reviewed the triage vital signs and the nursing notes.  Pertinent labs & imaging results that were available during my care of the patient were reviewed by me and considered in my medical decision making (see chart for details).    MDM Rules/Calculators/A&P                          This patient complains of abdominal jpain, this involves an extensive number of treatment options, and is a complaint that carries with it a high risk of complications and morbidity.    Differential Dx Gastro, pancreatitis, cholecystitis, KS, pyelo  Pertinent Labs I ordered, reviewed, and interpreted labs, which included lipase notable for 165, sodium 123. Patient denies alcohol use. She is not confused.  Imaging Interpretation I ordered imaging studies which included CT abdomen/pelvis, which showed acute pancreatitis and pseudocyst.   Medications I ordered medication Dilaudid, Zofran, and fluids.  Sources Previous records obtained and reviewed no recent visits.    Critical Interventions  None  Reassessments After the interventions stated above, I reevaluated the patient and found feeling  somewhat improved.  Consultants Appreciate Dr. Hal Hope for admitting the patient.  Plan Admit    Final Clinical Impression(s) / ED Diagnoses Final diagnoses:  Acute pancreatitis, unspecified complication status, unspecified pancreatitis type    Rx / DC Orders ED Discharge Orders    None       Montine Circle, PA-C 03/19/20 0050    Ripley Fraise, MD 03/19/20 0400

## 2020-03-19 ENCOUNTER — Encounter (HOSPITAL_COMMUNITY): Payer: Self-pay | Admitting: Internal Medicine

## 2020-03-19 DIAGNOSIS — E785 Hyperlipidemia, unspecified: Secondary | ICD-10-CM | POA: Diagnosis present

## 2020-03-19 DIAGNOSIS — Z79890 Hormone replacement therapy (postmenopausal): Secondary | ICD-10-CM | POA: Diagnosis not present

## 2020-03-19 DIAGNOSIS — I77811 Abdominal aortic ectasia: Secondary | ICD-10-CM | POA: Diagnosis present

## 2020-03-19 DIAGNOSIS — I10 Essential (primary) hypertension: Secondary | ICD-10-CM | POA: Diagnosis not present

## 2020-03-19 DIAGNOSIS — Z7982 Long term (current) use of aspirin: Secondary | ICD-10-CM | POA: Diagnosis not present

## 2020-03-19 DIAGNOSIS — D649 Anemia, unspecified: Secondary | ICD-10-CM | POA: Diagnosis present

## 2020-03-19 DIAGNOSIS — E871 Hypo-osmolality and hyponatremia: Secondary | ICD-10-CM

## 2020-03-19 DIAGNOSIS — F17210 Nicotine dependence, cigarettes, uncomplicated: Secondary | ICD-10-CM | POA: Diagnosis present

## 2020-03-19 DIAGNOSIS — F1010 Alcohol abuse, uncomplicated: Secondary | ICD-10-CM | POA: Diagnosis not present

## 2020-03-19 DIAGNOSIS — Z8261 Family history of arthritis: Secondary | ICD-10-CM | POA: Diagnosis not present

## 2020-03-19 DIAGNOSIS — Z833 Family history of diabetes mellitus: Secondary | ICD-10-CM | POA: Diagnosis not present

## 2020-03-19 DIAGNOSIS — R69 Illness, unspecified: Secondary | ICD-10-CM | POA: Diagnosis not present

## 2020-03-19 DIAGNOSIS — F32A Depression, unspecified: Secondary | ICD-10-CM | POA: Diagnosis present

## 2020-03-19 DIAGNOSIS — Z20822 Contact with and (suspected) exposure to covid-19: Secondary | ICD-10-CM | POA: Diagnosis not present

## 2020-03-19 DIAGNOSIS — Z8249 Family history of ischemic heart disease and other diseases of the circulatory system: Secondary | ICD-10-CM | POA: Diagnosis not present

## 2020-03-19 DIAGNOSIS — E039 Hypothyroidism, unspecified: Secondary | ICD-10-CM | POA: Diagnosis not present

## 2020-03-19 DIAGNOSIS — Z79899 Other long term (current) drug therapy: Secondary | ICD-10-CM | POA: Diagnosis not present

## 2020-03-19 DIAGNOSIS — K859 Acute pancreatitis without necrosis or infection, unspecified: Secondary | ICD-10-CM

## 2020-03-19 DIAGNOSIS — I69354 Hemiplegia and hemiparesis following cerebral infarction affecting left non-dominant side: Secondary | ICD-10-CM | POA: Diagnosis not present

## 2020-03-19 DIAGNOSIS — K852 Alcohol induced acute pancreatitis without necrosis or infection: Secondary | ICD-10-CM | POA: Diagnosis present

## 2020-03-19 DIAGNOSIS — Z823 Family history of stroke: Secondary | ICD-10-CM | POA: Diagnosis not present

## 2020-03-19 DIAGNOSIS — Z888 Allergy status to other drugs, medicaments and biological substances status: Secondary | ICD-10-CM | POA: Diagnosis not present

## 2020-03-19 DIAGNOSIS — M1990 Unspecified osteoarthritis, unspecified site: Secondary | ICD-10-CM | POA: Diagnosis present

## 2020-03-19 DIAGNOSIS — K863 Pseudocyst of pancreas: Secondary | ICD-10-CM | POA: Diagnosis not present

## 2020-03-19 DIAGNOSIS — Z83438 Family history of other disorder of lipoprotein metabolism and other lipidemia: Secondary | ICD-10-CM | POA: Diagnosis not present

## 2020-03-19 DIAGNOSIS — E860 Dehydration: Secondary | ICD-10-CM | POA: Diagnosis not present

## 2020-03-19 LAB — BASIC METABOLIC PANEL
Anion gap: 15 (ref 5–15)
Anion gap: 14 (ref 5–15)
BUN: <5 mg/dL — ABNORMAL LOW (ref 8–23)
BUN: <5 mg/dL — ABNORMAL LOW (ref 8–23)
CO2: 18 mmol/L — ABNORMAL LOW (ref 22–32)
CO2: 21 mmol/L — ABNORMAL LOW (ref 22–32)
Calcium: 8.5 mg/dL — ABNORMAL LOW (ref 8.9–10.3)
Calcium: 8.6 mg/dL — ABNORMAL LOW (ref 8.9–10.3)
Chloride: 93 mmol/L — ABNORMAL LOW (ref 98–111)
Chloride: 90 mmol/L — ABNORMAL LOW (ref 98–111)
Creatinine, Ser: 0.61 mg/dL (ref 0.44–1.00)
Creatinine, Ser: 0.62 mg/dL (ref 0.44–1.00)
GFR, Estimated: >60 mL/min (ref >60)
GFR, Estimated: >60 mL/min (ref >60)
Glucose, Bld: 145 mg/dL — ABNORMAL HIGH (ref 70–99)
Glucose, Bld: 173 mg/dL — ABNORMAL HIGH (ref 70–99)
Potassium: 3.7 mmol/L (ref 3.5–5.1)
Potassium: 3.6 mmol/L (ref 3.5–5.1)
Sodium: 126 mmol/L — ABNORMAL LOW (ref 135–145)
Sodium: 125 mmol/L — ABNORMAL LOW (ref 135–145)

## 2020-03-19 LAB — SODIUM, URINE, RANDOM: Sodium, Ur: 48 mmol/L

## 2020-03-19 LAB — TRIGLYCERIDES
Triglycerides: 89 mg/dL (ref <150)
Triglycerides: 70 mg/dL (ref <150)

## 2020-03-19 LAB — COMPREHENSIVE METABOLIC PANEL
ALT: 82 U/L — ABNORMAL HIGH (ref 0–44)
AST: 35 U/L (ref 15–41)
Albumin: 3.2 g/dL — ABNORMAL LOW (ref 3.5–5.0)
Alkaline Phosphatase: 83 U/L (ref 38–126)
Anion gap: 14 (ref 5–15)
BUN: <5 mg/dL — ABNORMAL LOW (ref 8–23)
CO2: 18 mmol/L — ABNORMAL LOW (ref 22–32)
Calcium: 8.7 mg/dL — ABNORMAL LOW (ref 8.9–10.3)
Chloride: 93 mmol/L — ABNORMAL LOW (ref 98–111)
Creatinine, Ser: 0.64 mg/dL (ref 0.44–1.00)
GFR, Estimated: >60 mL/min (ref >60)
Glucose, Bld: 143 mg/dL — ABNORMAL HIGH (ref 70–99)
Potassium: 3.8 mmol/L (ref 3.5–5.1)
Sodium: 125 mmol/L — ABNORMAL LOW (ref 135–145)
Total Bilirubin: 0.6 mg/dL (ref 0.3–1.2)
Total Protein: 5.9 g/dL — ABNORMAL LOW (ref 6.5–8.1)

## 2020-03-19 LAB — CBC
HCT: 39 % (ref 36.0–46.0)
Hemoglobin: 13.6 g/dL (ref 12.0–15.0)
MCH: 30.2 pg (ref 26.0–34.0)
MCHC: 34.9 g/dL (ref 30.0–36.0)
MCV: 86.5 fL (ref 80.0–100.0)
Platelets: 231 10*3/uL (ref 150–400)
RBC: 4.51 MIL/uL (ref 3.87–5.11)
RDW: 13.3 % (ref 11.5–15.5)
WBC: 6.7 10*3/uL (ref 4.0–10.5)
nRBC: 0 % (ref 0.0–0.2)

## 2020-03-19 LAB — HIV ANTIBODY (ROUTINE TESTING W REFLEX): HIV Screen 4th Generation wRfx: NONREACTIVE

## 2020-03-19 LAB — CBG MONITORING, ED
Glucose-Capillary: 135 mg/dL — ABNORMAL HIGH (ref 70–99)
Glucose-Capillary: 142 mg/dL — ABNORMAL HIGH (ref 70–99)
Glucose-Capillary: 142 mg/dL — ABNORMAL HIGH (ref 70–99)

## 2020-03-19 LAB — GLUCOSE, CAPILLARY: Glucose-Capillary: 154 mg/dL — ABNORMAL HIGH (ref 70–99)

## 2020-03-19 LAB — SARS CORONAVIRUS 2 (TAT 6-24 HRS): SARS Coronavirus 2: NEGATIVE

## 2020-03-19 LAB — OSMOLALITY, URINE: Osmolality, Ur: 294 mOsm/kg — ABNORMAL LOW (ref 300–900)

## 2020-03-19 LAB — TSH: TSH: 2.51 u[IU]/mL (ref 0.350–4.500)

## 2020-03-19 MED ORDER — LORAZEPAM 1 MG PO TABS: 1.0000 mg | ORAL_TABLET | ORAL | Status: DC | PRN

## 2020-03-19 MED ORDER — LEVOTHYROXINE SODIUM 100 MCG/5ML IV SOLN: 25.0000 ug | Freq: Every day | INTRAVENOUS | Status: DC

## 2020-03-19 MED ORDER — PANTOPRAZOLE SODIUM 40 MG IV SOLR
40.0000 mg | Freq: Two times a day (BID) | INTRAVENOUS | Status: DC
  Administered 2020-03-19 – 2020-03-21 (×5): 40 mg via INTRAVENOUS
  Filled 2020-03-19 (×5): qty 40

## 2020-03-19 MED ORDER — LACTATED RINGERS IV SOLN: INTRAVENOUS | Status: DC

## 2020-03-19 MED ORDER — LABETALOL HCL 5 MG/ML IV SOLN: 10.0000 mg | INTRAVENOUS | Status: DC | PRN

## 2020-03-19 MED ORDER — THIAMINE HCL 100 MG/ML IJ SOLN
100.0000 mg | Freq: Every day | INTRAMUSCULAR | Status: DC
  Filled 2020-03-19 (×2): qty 2

## 2020-03-19 MED ORDER — ADULT MULTIVITAMIN W/MINERALS CH
1.0000 | ORAL_TABLET | Freq: Every day | ORAL | Status: DC
  Administered 2020-03-19 – 2020-03-21 (×3): 1 via ORAL
  Filled 2020-03-19 (×3): qty 1

## 2020-03-19 MED ORDER — HYDROMORPHONE HCL 1 MG/ML IJ SOLN
1.0000 mg | INTRAMUSCULAR | Status: DC | PRN
  Administered 2020-03-19 – 2020-03-20 (×6): 1 mg via INTRAVENOUS
  Filled 2020-03-19 (×6): qty 1

## 2020-03-19 MED ORDER — FOLIC ACID 1 MG PO TABS
1.0000 mg | ORAL_TABLET | Freq: Every day | ORAL | Status: DC
  Administered 2020-03-19 – 2020-03-21 (×3): 1 mg via ORAL
  Filled 2020-03-19 (×3): qty 1

## 2020-03-19 MED ORDER — LACTATED RINGERS IV BOLUS
1000.0000 mL | Freq: Once | INTRAVENOUS | Status: AC
  Administered 2020-03-19: 1000 mL via INTRAVENOUS

## 2020-03-19 MED ORDER — HEPARIN SODIUM (PORCINE) 5000 UNIT/ML IJ SOLN
5000.0000 [IU] | Freq: Three times a day (TID) | INTRAMUSCULAR | Status: DC
  Administered 2020-03-19 – 2020-03-21 (×6): 5000 [IU] via SUBCUTANEOUS
  Filled 2020-03-19 (×6): qty 1

## 2020-03-19 MED ORDER — KETOROLAC TROMETHAMINE 15 MG/ML IJ SOLN
7.5000 mg | Freq: Three times a day (TID) | INTRAMUSCULAR | Status: DC | PRN
  Administered 2020-03-20: 7.5 mg via INTRAVENOUS
  Filled 2020-03-19: qty 1

## 2020-03-19 MED ORDER — MORPHINE SULFATE (PF) 2 MG/ML IV SOLN
2.0000 mg | INTRAVENOUS | Status: DC | PRN
  Administered 2020-03-19 (×2): 2 mg via INTRAVENOUS
  Filled 2020-03-19 (×2): qty 1

## 2020-03-19 MED ORDER — THIAMINE HCL 100 MG PO TABS
100.0000 mg | ORAL_TABLET | Freq: Every day | ORAL | Status: DC
  Administered 2020-03-19 – 2020-03-21 (×3): 100 mg via ORAL
  Filled 2020-03-19 (×3): qty 1

## 2020-03-19 MED ORDER — LORAZEPAM 2 MG/ML IJ SOLN: 1.0000 mg | INTRAMUSCULAR | Status: DC | PRN

## 2020-03-19 NOTE — Progress Notes (Signed)
Patient seen and examined.  Admission HPI reviewed.  Patient report abdominal pain, epigastric area, 9 out of 10 gallstone. Morphine is not helping with pain.  Patient is alert and conversant.  Alert and oriented x3.  1-Acute pancreatitis: With a small pseudocyst: Likely related to alcohol use; -Continue n.p.o. -Continue with IV fluids -Change morphine to Dilaudid -Added IV Protonix and Toradol  2-Hyponatremia; Slowly Improving Continue with IV fluids. Likely related to dehydration, potomania from alcohol.  She was also on a spironolactone.  Discontinue  3-Hypothyroidism: Continue with Synthroid 4-infrarenal abdominal aortic ectasis: She will require follow-up 5-mild transaminases: Likely from alcohol follow hepatitis panel 6-alcohol abuse: Continue with CIWA protocol

## 2020-03-19 NOTE — ED Notes (Signed)
Placed pt on purewick  

## 2020-03-19 NOTE — ED Notes (Signed)
hospitalist at bedside

## 2020-03-19 NOTE — ED Notes (Signed)
Pt has cell phone at bedside, received notification from secretary that pt family member was wanting to talk to pt; this nurse informed pt .

## 2020-03-19 NOTE — ED Notes (Signed)
Lab to add on triglycerides

## 2020-03-19 NOTE — H&P (Signed)
History and Physical    Jordan Bass ZDG:[id number] DOB: August 05, 1955 DOA: 03/18/2020  PCP: Gildardo Pounds, NP  Patient coming from: Home.  Chief Complaint: Abdominal pain.  HPI: Jordan Bass is a 65 y.o. female with history of hypertension, hypothyroidism, alcohol abuse drinks at least 2-3 beers every day presents to the ER because of persistent abdominal pain mostly in the epigastric area for the last 1 week with persistent nausea vomiting.  Denies any diarrhea chest pain or shortness of breath.  Patient states her last alcoholic drink was around a week ago because of the persistent pain she was not able to drink.  ED Course: In the ER CT abdomen pelvis shows features concerning for acute pancreatitis with possible pseudocyst and labs show a lipase of 165 ALT of 95.  Sodium 123.  Covid test is negative.  Patient started on fluid bolus and admitted for acute pancreatitis with hyponatremia.  Review of Systems: As per HPI, rest all negative.   Past Medical History:  Diagnosis Date  . Anemia   . Arthritis   . Bilateral calcaneal spurs    heel spurs  . Hyperlipidemia   . Hypertension   . Stroke ([place]) 2016   weakness on left side/uses cane/in coma for 45 days  . Thyroid disease   . Weakness    left side weakness due to stroke/uses cane    Past Surgical History:  Procedure Laterality Date  . COLONOSCOPY     over 10 years ago in [place]  . HAND SURGERY  1987   right hand surgery due to caught in bacon machine/ use ligament from stomach to repair your hand     reports that she has been smoking cigarettes. She has a 13.20 pack-year smoking history. She has never used smokeless tobacco. She reports current alcohol use of about 7.0 standard drinks of alcohol per week. She reports that she does not use drugs.  Allergies  Allergen Reactions  . Seasonal Ic [Cholestatin]     No meds per pt.    Family History  Problem Relation Age of Onset  . Arthritis Mother   .  Cancer Mother   . Diabetes Mother   . Heart disease Mother   . Hyperlipidemia Mother   . Hypertension Mother   . Stroke Father   . Cancer Father   . Arthritis Father   . Heart disease Sister   . Arthritis Brother   . Hypertension Brother   . Healthy Sister   . Hypertension Brother   . Hypertension Brother   . Hypertension Brother   . Pneumonia Sister   . Pneumonia Sister   . Pneumonia Brother   . Pneumonia Brother   . Heart disease Brother   . AAA (abdominal aortic aneurysm) Brother   . Colon polyps Neg Hx   . Esophageal cancer Neg Hx   . Rectal cancer Neg Hx   . Stomach cancer Neg Hx     Prior to Admission medications   Medication Sig Start Date End Date Taking? Authorizing Provider  amLODipine (NORVASC) 5 MG tablet TAKE 1 TABLET(5 MG) BY MOUTH DAILY 02/21/20   Charlott Rakes, MD  levothyroxine (SYNTHROID) 50 MCG tablet TAKE 1 TABLET(50 MCG) BY MOUTH DAILY BEFORE BREAKFAST 02/21/20   Charlott Rakes, MD  lisinopril (ZESTRIL) 10 MG tablet TAKE 1 TABLET(10 MG) BY MOUTH DAILY 02/21/20   Charlott Rakes, MD  spironolactone (ALDACTONE) 25 MG tablet TAKE 1 TABLET(25 MG) BY MOUTH DAILY 02/21/20   Newlin,  Enobong, MD  aspirin EC 81 MG tablet Take 1 tablet (81 mg total) by mouth daily. 02/19/20 05/19/20  Gildardo Pounds, NP  Cholecalciferol (VITAMIN D3) 1000 units CAPS Take by mouth.    [provider]  famotidine (PEPCID) 20 MG tablet Take 1 tablet (20 mg total) by mouth daily. 02/19/20 05/19/20  Gildardo Pounds, NP  gabapentin (NEURONTIN) 600 MG tablet Take 1 tablet (600 mg total) by mouth 3 (three) times daily. 02/19/20   Gildardo Pounds, NP  metoprolol tartrate (LOPRESSOR) 25 MG tablet Take 1 tablet (25 mg total) by mouth 2 (two) times daily. 02/19/20   Gildardo Pounds, NP  rosuvastatin (CRESTOR) 5 MG tablet Take 1 tablet (5 mg total) by mouth daily. 02/19/20 05/19/20  Gildardo Pounds, NP  Vitamin D, Ergocalciferol, (DRISDOL) 1.25 MG (50000 UNIT) CAPS capsule Take 1 capsule (50,000  Units total) by mouth every 7 (seven) days. ONCE A WEEK NOT DAILY!!! 02/19/20   Gildardo Pounds, NP    Physical Exam: Constitutional: Moderately built and nourished. Vitals:   03/18/20 2315 03/19/20 0009 03/19/20 0030 03/19/20 0115  BP: 134/81  129/84 129/62  Pulse: 88  84   Resp:  14 14 12   Temp:      TempSrc:      SpO2: 93% 97% 94%    Eyes: Anicteric no pallor. ENMT: No discharge from the ears eyes nose or mouth. Neck: No mass felt.  No neck rigidity. Respiratory: No rhonchi or crepitations. Cardiovascular: S1-S2 heard. Abdomen: Epigastric tenderness no guarding or rigidity. Musculoskeletal: No edema. Skin: No rash. Neurologic: Alert awake oriented to time place and person.  Moves all extremities. Psychiatric: Appears normal.  Normal affect.   Labs on Admission: I have personally reviewed following labs and imaging studies  CBC: Recent Labs  Lab 03/18/20 1835  WBC 5.5  HGB 14.1  HCT 41.8  MCV 85.1  PLT 295   Basic Metabolic Panel: Recent Labs  Lab 03/18/20 1835  NA 123*  K 3.8  CL 89*  CO2 22  GLUCOSE 168*  BUN <5*  CREATININE 0.66  CALCIUM 9.4   GFR: CrCl cannot be calculated (Unknown ideal weight.). Liver Function Tests: Recent Labs  Lab 03/18/20 1835  AST 40  ALT 98*  ALKPHOS 90  BILITOT 0.7  PROT 6.8  ALBUMIN 3.7   Recent Labs  Lab 03/18/20 1835  LIPASE 165*   No results for input(s): AMMONIA in the last 168 hours. Coagulation Profile: No results for input(s): INR, PROTIME in the last 168 hours. Cardiac Enzymes: No results for input(s): CKTOTAL, CKMB, CKMBINDEX, TROPONINI in the last 168 hours. BNP (last 3 results) No results for input(s): PROBNP in the last 8760 hours. HbA1C: No results for input(s): HGBA1C in the last 72 hours. CBG: No results for input(s): GLUCAP in the last 168 hours. Lipid Profile: No results for input(s): CHOL, HDL, LDLCALC, TRIG, CHOLHDL, LDLDIRECT in the last 72 hours. Thyroid Function Tests: No results  for input(s): TSH, T4TOTAL, FREET4, T3FREE, THYROIDAB in the last 72 hours. Anemia Panel: No results for input(s): VITAMINB12, FOLATE, FERRITIN, TIBC, IRON, RETICCTPCT in the last 72 hours. Urine analysis:    Component Value Date/Time   COLORURINE AMBER (A) 03/18/2020 1835   APPEARANCEUR CLOUDY (A) 03/18/2020 1835   LABSPEC 1.011 03/18/2020 1835   PHURINE 5.0 03/18/2020 1835   GLUCOSEU NEGATIVE 03/18/2020 1835   HGBUR NEGATIVE 03/18/2020 [place] NEGATIVE 03/18/2020 1835   KETONESUR 5 (A) 03/18/2020 1835  PROTEINUR NEGATIVE 03/18/2020 1835   NITRITE POSITIVE (A) 03/18/2020 Liborio Negron Torres (A) 03/18/2020 1835   Sepsis Labs: @LABRCNTIP (procalcitonin:4,lacticidven:4) )No results found for this or any previous visit (from the past 240 hour(s)).   Radiological Exams on Admission: CT ABDOMEN PELVIS W CONTRAST  Result Date: 03/18/2020 CLINICAL DATA:  Central abdominal pain EXAM: CT ABDOMEN AND PELVIS WITH CONTRAST TECHNIQUE: Multidetector CT imaging of the abdomen and pelvis was performed using the standard protocol following bolus administration of intravenous contrast. CONTRAST:  153mL OMNIPAQUE IOHEXOL 300 MG/ML  SOLN COMPARISON:  None. FINDINGS: Lower chest: The visualized heart size within normal limits. No pericardial fluid/thickening. No hiatal hernia. Mild bronchus opacity seen at both lung bases. Hepatobiliary: There is diffuse low density seen throughout the liver parenchyma with probable focal fatty sparing seen within the anterior left liver lobe and adjacent to the the gallbladder bed. Main portal vein is patent. No evidence of calcified gallstones, gallbladder wall thickening or biliary dilatation. Pancreas: There is edema and slight enlargement of the pancreatic head with surrounding fat stranding changes. Fat stranding changes extend surrounding the pancreatic body and tail. Tiny calcifications seen within the pancreatic tail. Adjacent to the pancreatic tail  there is a 7 mm small hypodense area. Fat stranding changes extend within the right retroperitoneum. Spleen: Normal in size without focal abnormality. Adrenals/Urinary Tract: Both adrenal glands appear normal. A 2 cm low-density lesion seen within the lower pole of the right kidney. Bladder is unremarkable. Stomach/Bowel: The stomach is normal in appearance. There is inflammatory changes and narrowing seen around the second portion of the duodenum. The remainder of the small bowel is unremarkable. The colon is unremarkable. The appendix is normal. Vascular/Lymphatic: There are no enlarged mesenteric, retroperitoneal, or pelvic lymph nodes. There is a tortuous intra-abdominal aorta and within the infrarenal portion there is focal area of ectasia measuring up to 3.1 cm. Also near the iliac bifurcation there is a focal area ectasia measuring up to 3.1 cm in transverse dimension. Scattered aortic and iliac vasculature calcifications are noted. Reproductive: A small amount of fluid seen within the endometrial canal. Other: No evidence of abdominal wall mass or hernia. Musculoskeletal: No acute or significant osseous findings. IMPRESSION: 1. Findings consistent with acute pancreatitis with surrounding inflammatory changes extending into the retroperitoneum. There is a tiny 7 mm hypodensity adjacent to the pancreatic tail which could represent a small pseudocyst. No definite evidence of pancreatic necrosis 2. Inflammatory changes and mild narrowing seen within the second portion of the duodenum 3. Hepatic steatosis 4. Focal areas of ectasia measuring up to 3.1 cm within the infrarenal portion of the abdominal aorta. Recommend follow-up ultrasound every 3 years. This recommendation follows ACR consensus guidelines: White Paper of the ACR Incidental Findings Committee II on Vascular Findings. J Am Coll Radiol 2013; 10:789-794. Electronically Signed   By: Prudencio Pair M.D.   On: 03/18/2020 23:50      Assessment/Plan Principal Problem:   Acute pancreatitis Active Problems:   Alcohol abuse   Hyponatremia    1. Acute pancreatitis with possible pseudocyst developing likely due to alcohol abuse.  No definite evidence of any gallstones in the CT scan.  We will also check triglyceride levels.  We will keep patient n.p.o. for now and aggressive fluid hydration and follow metabolic panel closely and along with hemoglobin and hematocrit. 2. Hyponatremia could be multifactorial including dehydration, beer portal mania, spironolactone.  Since patient clinically looks dehydrated we will closely monitor metabolic panel and hydrate check urine  studies TSH and cortisol. 3. Alcohol abuse last drink was about a week ago he usually drinks 2-3 beers every day.  Advised about quitting presently on CIWA protocol. 4. Hypertension we will keep patient on as needed IV labetalol until patient can start orally. 5. Hypothyroidism on IV Synthroid. 6. Mildly elevated AST could be from alcohol abuse.  Check acute hepatitis panel and follow LFTs with next blood draw. 7. Infrarenal abdominal aortic ectasia will need follow-up.  Since patient has acute pancreatitis will need close monitoring with close follow-up will need inpatient status.   DVT prophylaxis: Heparin. Code Status: Full code. Family Communication: Discussed with patient. Disposition Plan: Home. Consults called: None. Admission status: Inpatient.   Rise Patience MD [facility] Pager [phone].  If 7PM-7AM, please contact night-coverage [email or web address] Password [email or web address]  03/19/2020, 1:28 AM

## 2020-03-19 NOTE — ED Notes (Signed)
Provider at bedside

## 2020-03-19 NOTE — ED Notes (Signed)
Lab to add on Sodium and Osmolality

## 2020-03-20 DIAGNOSIS — F1010 Alcohol abuse, uncomplicated: Secondary | ICD-10-CM

## 2020-03-20 LAB — CBC
HCT: 37.4 % (ref 36.0–46.0)
Hemoglobin: 12.8 g/dL (ref 12.0–15.0)
MCH: 29 pg (ref 26.0–34.0)
MCHC: 34.2 g/dL (ref 30.0–36.0)
MCV: 84.8 fL (ref 80.0–100.0)
Platelets: 205 10*3/uL (ref 150–400)
RBC: 4.41 MIL/uL (ref 3.87–5.11)
RDW: 13.3 % (ref 11.5–15.5)
WBC: 10.7 10*3/uL — ABNORMAL HIGH (ref 4.0–10.5)
nRBC: 0 % (ref 0.0–0.2)

## 2020-03-20 LAB — BASIC METABOLIC PANEL
Anion gap: 14 (ref 5–15)
BUN: <5 mg/dL — ABNORMAL LOW (ref 8–23)
CO2: 21 mmol/L — ABNORMAL LOW (ref 22–32)
Calcium: 8.7 mg/dL — ABNORMAL LOW (ref 8.9–10.3)
Chloride: 91 mmol/L — ABNORMAL LOW (ref 98–111)
Creatinine, Ser: 0.61 mg/dL (ref 0.44–1.00)
GFR, Estimated: >60 mL/min (ref >60)
Glucose, Bld: 149 mg/dL — ABNORMAL HIGH (ref 70–99)
Potassium: 3.7 mmol/L (ref 3.5–5.1)
Sodium: 126 mmol/L — ABNORMAL LOW (ref 135–145)

## 2020-03-20 LAB — LIPASE, BLOOD: Lipase: 178 U/L — ABNORMAL HIGH (ref 11–51)

## 2020-03-20 LAB — GLUCOSE, CAPILLARY
Glucose-Capillary: 156 mg/dL — ABNORMAL HIGH (ref 70–99)
Glucose-Capillary: 135 mg/dL — ABNORMAL HIGH (ref 70–99)
Glucose-Capillary: 145 mg/dL — ABNORMAL HIGH (ref 70–99)
Glucose-Capillary: 164 mg/dL — ABNORMAL HIGH (ref 70–99)

## 2020-03-20 MED ORDER — METOPROLOL TARTRATE 25 MG PO TABS
25.0000 mg | ORAL_TABLET | Freq: Two times a day (BID) | ORAL | Status: DC
Start: 2020-03-20 — End: 2020-03-21
  Administered 2020-03-20 – 2020-03-21 (×2): 25 mg via ORAL
  Filled 2020-03-20 (×2): qty 1

## 2020-03-20 MED ORDER — LEVOTHYROXINE SODIUM 50 MCG PO TABS
50.0000 ug | ORAL_TABLET | Freq: Every day | ORAL | Status: DC
  Administered 2020-03-21: 50 ug via ORAL
  Filled 2020-03-20: qty 1

## 2020-03-20 MED ORDER — HYDROMORPHONE HCL 1 MG/ML IJ SOLN
0.7500 mg | INTRAMUSCULAR | Status: DC | PRN
  Administered 2020-03-20: 0.75 mg via INTRAVENOUS
  Filled 2020-03-20: qty 1

## 2020-03-20 NOTE — Progress Notes (Signed)
PROGRESS NOTE   Jordan Bass  VHQ:[id number] DOB: 1955-03-06 DOA: 03/18/2020 PCP: Gildardo Pounds, NP   Chief Complaint  Patient presents with  . Abdominal Pain  . Nausea  . Emesis   Level of care: Telemetry Medical  Brief Admission History:   65 y.o. female with history of hypertension, hypothyroidism, alcohol abuse drinks at least 2-3 beers every day presents to the ER because of persistent abdominal pain mostly in the epigastric area for the last 1 week with persistent nausea vomiting.  Denies any diarrhea chest pain or shortness of breath.  Patient states her last alcoholic drink was around a week ago because of the persistent pain she was not able to drink.  ED Course: In the ER CT abdomen pelvis shows features concerning for acute pancreatitis with possible pseudocyst and labs show a lipase of 165 ALT of 95.  Sodium 123.  Covid test is negative.  Patient started on fluid bolus and admitted for acute pancreatitis with hyponatremia.   Assessment & Plan:   Principal Problem:   Acute pancreatitis Active Problems:   Alcohol abuse   Hyponatremia   1. Acute pancreatitis - likely alcohol induced, continue supportive therapy. Trial of clear liquid diet today.  Wean IV opioid as able.   2. Alcohol - Continue CIWA protocol.  3. Hyponatremia - from dehydration - continue IV LR and follow.   4. Essential hypertension - IV labetalol ordered as needed.  5. Hypothyroidism - IV replacement.   6. Elevated LFTs - from chronic alcohol abuse.  Follow.  7. Infrarenal abdominal aortic ectasia - outpatient follow up.    DVT prophylaxis: SQ heparin  Code Status:  Full  Family Communication:  Disposition: Home  Status is: Inpatient  Remains inpatient appropriate because:Persistent severe electrolyte disturbances, IV treatments appropriate due to intensity of illness or inability to take PO and Inpatient level of care appropriate due to severity of illness   Dispo: The patient is from:  Home              Anticipated d/c is to: Home              Patient currently is not medically stable to d/c.   Difficult to place patient No       Consultants:     Procedures:     Antimicrobials:     Subjective: PT reports feeling a little better today.  Pt would like to try clear liquid.   Objective: Vitals:   03/19/20 1916 03/19/20 2345 03/20/20 0552 03/20/20 1209  BP: (!) 158/88 116/71 128/75 113/61  Pulse: 98 (!) 106 (!) 110 (!) 119  Resp: 16 18 17    Temp: 97.6 F (36.4 C) 98.2 F (36.8 C) 98.3 F (36.8 C) 99.3 F (37.4 C)  TempSrc: Oral Oral Oral Oral  SpO2: 98% 97% 94% 93%    Intake/Output Summary (Last 24 hours) at 03/20/2020 1210 Last data filed at 03/20/2020 4132 Gross per 24 hour  Intake 406.65 ml  Output 600 ml  Net -193.35 ml   There were no vitals filed for this visit.  Examination:  General exam: Appears calm and comfortable  Respiratory system: Clear to auscultation. Respiratory effort normal. Cardiovascular system: normal S1 & S2 heard. No JVD, murmurs, rubs, gallops or clicks. No pedal edema. Gastrointestinal system: Abdomen is nondistended, soft and mild epigastric tenderness. No organomegaly or masses felt. Normal bowel sounds heard. Central nervous system: Alert and oriented. No focal neurological deficits. Extremities: Symmetric 5 x 5  power. Skin: No rashes, lesions or ulcers Psychiatry: Judgement and insight appear normal. Mood & affect appropriate.   Data Reviewed: I have personally reviewed following labs and imaging studies  CBC: Recent Labs  Lab 03/18/20 1835 03/19/20 0303 03/20/20 0138  WBC 5.5 6.7 10.7*  HGB 14.1 13.6 12.8  HCT 41.8 39.0 37.4  MCV 85.1 86.5 84.8  PLT 266 231 549    Basic Metabolic Panel: Recent Labs  Lab 03/18/20 1835 03/19/20 0303 03/19/20 0848 03/19/20 1916 03/20/20 0138  NA 123* 125* 126* 125* 126*  K 3.8 3.8 3.7 3.6 3.7  CL 89* 93* 93* 90* 91*  CO2 22 18* 18* 21* 21*  GLUCOSE 168* 143*  145* 173* 149*  BUN <5* <5* <5* <5* <5*  CREATININE 0.66 0.64 0.61 0.62 0.61  CALCIUM 9.4 8.7* 8.5* 8.6* 8.7*    GFR: CrCl cannot be calculated (Unknown ideal weight.).  Liver Function Tests: Recent Labs  Lab 03/18/20 1835 03/19/20 0303  AST 40 35  ALT 98* 82*  ALKPHOS 90 83  BILITOT 0.7 0.6  PROT 6.8 5.9*  ALBUMIN 3.7 3.2*    CBG: Recent Labs  Lab 03/19/20 0715 03/19/20 1201 03/19/20 1902 03/20/20 0212 03/20/20 1157  GLUCAP 142* 142* 154* 156* 135*    Recent Results (from the past 240 hour(s))  SARS CORONAVIRUS 2 (TAT 6-24 HRS) Nasopharyngeal Nasopharyngeal Swab     Status: None   Collection Time: 03/18/20 10:24 PM   Specimen: Nasopharyngeal Swab  Result Value Ref Range Status   SARS Coronavirus 2 NEGATIVE NEGATIVE Final    Comment: (NOTE) SARS-CoV-2 target nucleic acids are NOT DETECTED.  The SARS-CoV-2 RNA is generally detectable in upper and lower respiratory specimens during the acute phase of infection. Negative results do not preclude SARS-CoV-2 infection, do not rule out co-infections with other pathogens, and should not be used as the sole basis for treatment or other patient management decisions. Negative results must be combined with clinical observations, patient history, and epidemiological information. The expected result is Negative.  Fact Sheet for Patients: SugarRoll.be  Fact Sheet for Healthcare Providers: https://www.woods-mathews.com/  This test is not yet approved or cleared by the [place] FDA and  has been authorized for detection and/or diagnosis of SARS-CoV-2 by FDA under an Emergency Use Authorization (EUA). This EUA will remain  in effect (meaning this test can be used) for the duration of the COVID-19 declaration under Se ction 564(b)(1) of the Act, 21 U.S.C. section 360bbb-3(b)(1), unless the authorization is terminated or revoked sooner.  Performed at [facility],  [place] [address], [place], [place] [address]      Radiology Studies: CT ABDOMEN PELVIS W CONTRAST  Result Date: 03/18/2020 CLINICAL DATA:  Central abdominal pain EXAM: CT ABDOMEN AND PELVIS WITH CONTRAST TECHNIQUE: Multidetector CT imaging of the abdomen and pelvis was performed using the standard protocol following bolus administration of intravenous contrast. CONTRAST:  128mL OMNIPAQUE IOHEXOL 300 MG/ML  SOLN COMPARISON:  None. FINDINGS: Lower chest: The visualized heart size within normal limits. No pericardial fluid/thickening. No hiatal hernia. Mild bronchus opacity seen at both lung bases. Hepatobiliary: There is diffuse low density seen throughout the liver parenchyma with probable focal fatty sparing seen within the anterior left liver lobe and adjacent to the the gallbladder bed. Main portal vein is patent. No evidence of calcified gallstones, gallbladder wall thickening or biliary dilatation. Pancreas: There is edema and slight enlargement of the pancreatic head with surrounding fat stranding changes. Fat stranding changes extend  surrounding the pancreatic body and tail. Tiny calcifications seen within the pancreatic tail. Adjacent to the pancreatic tail there is a 7 mm small hypodense area. Fat stranding changes extend within the right retroperitoneum. Spleen: Normal in size without focal abnormality. Adrenals/Urinary Tract: Both adrenal glands appear normal. A 2 cm low-density lesion seen within the lower pole of the right kidney. Bladder is unremarkable. Stomach/Bowel: The stomach is normal in appearance. There is inflammatory changes and narrowing seen around the second portion of the duodenum. The remainder of the small bowel is unremarkable. The colon is unremarkable. The appendix is normal. Vascular/Lymphatic: There are no enlarged mesenteric, retroperitoneal, or pelvic lymph nodes. There is a tortuous intra-abdominal aorta and within the infrarenal portion there is focal area of ectasia measuring  up to 3.1 cm. Also near the iliac bifurcation there is a focal area ectasia measuring up to 3.1 cm in transverse dimension. Scattered aortic and iliac vasculature calcifications are noted. Reproductive: A small amount of fluid seen within the endometrial canal. Other: No evidence of abdominal wall mass or hernia. Musculoskeletal: No acute or significant osseous findings. IMPRESSION: 1. Findings consistent with acute pancreatitis with surrounding inflammatory changes extending into the retroperitoneum. There is a tiny 7 mm hypodensity adjacent to the pancreatic tail which could represent a small pseudocyst. No definite evidence of pancreatic necrosis 2. Inflammatory changes and mild narrowing seen within the second portion of the duodenum 3. Hepatic steatosis 4. Focal areas of ectasia measuring up to 3.1 cm within the infrarenal portion of the abdominal aorta. Recommend follow-up ultrasound every 3 years. This recommendation follows ACR consensus guidelines: White Paper of the ACR Incidental Findings Committee II on Vascular Findings. J Am Coll Radiol 2013; 10:789-794. Electronically Signed   By: Prudencio Pair M.D.   On: 03/18/2020 23:50   Scheduled Meds: . folic acid  1 mg Oral Daily  . heparin  5,000 Units Subcutaneous Q8H  . [START ON 03/22/2020] levothyroxine  25 mcg Intravenous Daily  . multivitamin with minerals  1 tablet Oral Daily  . pantoprazole (PROTONIX) IV  40 mg Intravenous Q12H  . thiamine  100 mg Oral Daily   Or  . thiamine  100 mg Intravenous Daily   Continuous Infusions: . lactated ringers 125 mL/hr at 03/20/20 0857     LOS: 1 day   Time spent: 36 mins   Finnian Husted Wynetta Emery, MD How to contact the [facility] Attending or Consulting provider [place] or covering provider during after hours [place], for this patient?  1. Check the care team in [facility] and look for a) attending/consulting TRH provider listed and b) the [facility] team listed 2. Log into [email or web address] and use [place]'s universal password  to access. If you do not have the password, please contact the hospital operator. 3. Locate the [facility] provider you are looking for under [facility] and page to a number that you can be directly reached. 4. If you still have difficulty reaching the provider, please page the [facility] (Director on Call) for the Hospitalists listed on amion for assistance.  03/20/2020, 12:10 PM

## 2020-03-20 NOTE — Progress Notes (Signed)
Patient Yellow MEWS due to tachycardia. MD Wynetta Emery notified with v/s and yellow MEWS started. MD verified receipt of information by replying "ok." No new orders put in. Will continue to monitor and assess per CIWA and MEWS protocol.   03/20/20 1209  Assess: MEWS Score  Temp 99.3 F (37.4 C)  BP 113/61  Pulse Rate (!) 119  Resp 18  SpO2 93 %  Assess: MEWS Score  MEWS Temp 0  MEWS Systolic 0  MEWS Pulse 2  MEWS RR 0  MEWS LOC 0  MEWS Score 2  MEWS Score Color Yellow  Treat  MEWS Interventions Escalated (See documentation below)  Take Vital Signs  Increase Vital Sign Frequency  Yellow: Q 2hr X 2 then Q 4hr X 2, if remains yellow, continue Q 4hrs  Escalate  MEWS: Escalate Yellow: discuss with charge nurse/RN and consider discussing with provider and RRT  Notify: Charge Nurse/RN  Name of Charge Nurse/RN Notified Otila Kluver  Date Charge Nurse/RN Notified 03/20/20  Time Charge Nurse/RN Notified 1225  Notify: Provider  Provider Name/Title MD Irwin Brakeman  Date Provider Notified 03/20/20  Time Provider Notified 1226  Notification Type Call (didn't answer, secured chat)  Notification Reason Other (Comment) (protocl)  Provider response No new orders  Date of Provider Response 03/20/20  Time of Provider Response 1226  Document  Patient Outcome Other (Comment) (continuing to monitor- patient stable)  Progress note created (see row info) Yes

## 2020-03-20 NOTE — Progress Notes (Signed)
03/20/2020 12:41 PM  RN Gunnar Bulla was trying to reach me by calling the [place] main number [phone].  I never received a page and finally received a secure chat.  The protocol has always been to secure chat or page.    Murvin Natal MD  How to contact the [facility] Attending or Consulting provider [place] or covering provider during after hours [place], for this patient?  1. Check the care team in [facility] and look for a) attending/consulting TRH provider listed and b) the [facility] team listed 2. Log into [email or web address] and use [place]'s universal password to access. If you do not have the password, please contact the hospital operator. 3. Locate the [facility] provider you are looking for under [facility] and page to a number that you can be directly reached. 4. If you still have difficulty reaching the provider, please page the [facility] (Director on Call) for the Hospitalists listed on amion for assistance.

## 2020-03-20 NOTE — TOC CAGE-AID Note (Signed)
Transition of Care [facility]) - CAGE-AID Screening   Patient Details  Name: Jordan Bass MRN: [id number] Date of Birth: 1955/12/25  [facility] CM/SW Contact:    Emeterio Reeve, [place] Phone Number: 03/20/2020, 4:22 PM   Clinical Narrative:  Pt reports she drinks 1-2 beers a day. Pt denies alcohol use. Pt states she does not need resources at this time.   CAGE-AID Screening:    Have You Ever Felt You Ought to Cut Down on Your Drinking or Drug Use?: Yes Have People Annoyed You By Critizing Your Drinking Or Drug Use?: No Have You Felt Bad Or Guilty About Your Drinking Or Drug Use?: No Have You Ever Had a Drink or Used Drugs First Thing In The Morning to Steady Your Nerves or to Get Rid of a Hangover?: No CAGE-AID Score: 1  Substance Abuse Education Offered: Yes     Blima Ledger, [place] Social Worker [phone]

## 2020-03-21 ENCOUNTER — Other Ambulatory Visit: Payer: Self-pay

## 2020-03-21 ENCOUNTER — Encounter (HOSPITAL_COMMUNITY): Payer: Self-pay | Admitting: Internal Medicine

## 2020-03-21 DIAGNOSIS — F1010 Alcohol abuse, uncomplicated: Secondary | ICD-10-CM | POA: Diagnosis not present

## 2020-03-21 DIAGNOSIS — E871 Hypo-osmolality and hyponatremia: Secondary | ICD-10-CM | POA: Diagnosis not present

## 2020-03-21 DIAGNOSIS — K852 Alcohol induced acute pancreatitis without necrosis or infection: Secondary | ICD-10-CM | POA: Diagnosis not present

## 2020-03-21 LAB — COMPREHENSIVE METABOLIC PANEL
ALT: 46 U/L — ABNORMAL HIGH (ref 0–44)
AST: 30 U/L (ref 15–41)
Albumin: 2.2 g/dL — ABNORMAL LOW (ref 3.5–5.0)
Alkaline Phosphatase: 75 U/L (ref 38–126)
Anion gap: 12 (ref 5–15)
BUN: <5 mg/dL — ABNORMAL LOW (ref 8–23)
CO2: 22 mmol/L (ref 22–32)
Calcium: 8.3 mg/dL — ABNORMAL LOW (ref 8.9–10.3)
Chloride: 96 mmol/L — ABNORMAL LOW (ref 98–111)
Creatinine, Ser: 0.76 mg/dL (ref 0.44–1.00)
GFR, Estimated: >60 mL/min (ref >60)
Glucose, Bld: 108 mg/dL — ABNORMAL HIGH (ref 70–99)
Potassium: 3.5 mmol/L (ref 3.5–5.1)
Sodium: 130 mmol/L — ABNORMAL LOW (ref 135–145)
Total Bilirubin: 0.9 mg/dL (ref 0.3–1.2)
Total Protein: 4.4 g/dL — ABNORMAL LOW (ref 6.5–8.1)

## 2020-03-21 LAB — CBC
HCT: 31.3 % — ABNORMAL LOW (ref 36.0–46.0)
Hemoglobin: 11.3 g/dL — ABNORMAL LOW (ref 12.0–15.0)
MCH: 30.1 pg (ref 26.0–34.0)
MCHC: 36.1 g/dL — ABNORMAL HIGH (ref 30.0–36.0)
MCV: 83.2 fL (ref 80.0–100.0)
Platelets: 145 10*3/uL — ABNORMAL LOW (ref 150–400)
RBC: 3.76 MIL/uL — ABNORMAL LOW (ref 3.87–5.11)
RDW: 13.4 % (ref 11.5–15.5)
WBC: 10.6 10*3/uL — ABNORMAL HIGH (ref 4.0–10.5)
nRBC: 0 % (ref 0.0–0.2)

## 2020-03-21 LAB — LIPASE, BLOOD: Lipase: 51 U/L (ref 11–51)

## 2020-03-21 LAB — MAGNESIUM: Magnesium: 1.5 mg/dL — ABNORMAL LOW (ref 1.7–2.4)

## 2020-03-21 LAB — GLUCOSE, CAPILLARY
Glucose-Capillary: 116 mg/dL — ABNORMAL HIGH (ref 70–99)
Glucose-Capillary: 105 mg/dL — ABNORMAL HIGH (ref 70–99)

## 2020-03-21 MED ORDER — THIAMINE HCL 100 MG PO TABS: 100.0000 mg | ORAL_TABLET | Freq: Every day | ORAL | 1 refills | Status: AC

## 2020-03-21 MED ORDER — MAGNESIUM SULFATE 4 GM/100ML IV SOLN
4.0000 g | Freq: Once | INTRAVENOUS | Status: DC
  Filled 2020-03-21 (×2): qty 100

## 2020-03-21 MED ORDER — PANTOPRAZOLE SODIUM 40 MG PO TBEC: 40.0000 mg | DELAYED_RELEASE_TABLET | Freq: Every day | ORAL | 1 refills | Status: AC

## 2020-03-21 MED ORDER — HYDROMORPHONE HCL 1 MG/ML IJ SOLN: 0.5000 mg | INTRAMUSCULAR | Status: DC | PRN

## 2020-03-21 MED ORDER — FOLIC ACID 1 MG PO TABS: 1.0000 mg | ORAL_TABLET | Freq: Every day | ORAL | 1 refills | Status: AC

## 2020-03-21 MED ORDER — AMLODIPINE BESYLATE 5 MG PO TABS: 5.0000 mg | ORAL_TABLET | Freq: Every day | ORAL | Status: DC

## 2020-03-21 MED ORDER — ADULT MULTIVITAMIN W/MINERALS CH: 1.0000 | ORAL_TABLET | Freq: Every day | ORAL | Status: AC

## 2020-03-21 MED ORDER — VITAMIN D (ERGOCALCIFEROL) 1.25 MG (50000 UNIT) PO CAPS: 50000.0000 [IU] | ORAL_CAPSULE | ORAL | Status: AC

## 2020-03-21 NOTE — Discharge Summary (Signed)
Physician Discharge Summary  Jordan Bass TFT:[id number] DOB: 04/18/1955 DOA: 03/18/2020  PCP: Gildardo Pounds, NP  Admit date: 03/18/2020 Discharge date: 03/21/2020  Admitted From: Home  Disposition:  Home   Recommendations for Outpatient Follow-up:  1. Follow up with PCP in 1 weeks  Discharge Condition: STABLE   CODE STATUS: FULL  DIET:  Soft foods diet, avoid all alcohol consumption    Brief Hospitalization Summary: Please see all hospital notes, images, labs for full details of the hospitalization. ADMISSION HPI: Jordan Bass is a 65 y.o. female with history of hypertension, hypothyroidism, alcohol abuse drinks at least 2-3 beers every day presents to the ER because of persistent abdominal pain mostly in the epigastric area for the last 1 week with persistent nausea vomiting.  Denies any diarrhea chest pain or shortness of breath.  Patient states her last alcoholic drink was around a week ago because of the persistent pain she was not able to drink.  ED Course: In the ER CT abdomen pelvis shows features concerning for acute pancreatitis with possible pseudocyst and labs show a lipase of 165 ALT of 95.  Sodium 123.  Covid test is negative. Patient started on fluid bolus and admitted for acute pancreatitis with hyponatremia.    Hospital Course    1. Acute pancreatitis - RESOLVED.  Likely alcohol induced, treated with supportive therapy and bowel rest. Pt is now able to tolerate diet and lipase has normalized.   2. Alcohol - Pt was on CIWA protocol but no significant alcohol withdrawal was seen.  3. Hyponatremia - from dehydration -treated with IV LR and sodium now improved to 130.    4. Essential hypertension - resumed home metoprolol with good results.   5. Hypothyroidism - resumed home levothyroxine.    6. Elevated LFTs - from chronic alcohol abuse.  Follow.  7. Infrarenal abdominal aortic ectasia - outpatient follow up.    DVT prophylaxis: SQ heparin  Code Status:   Full  Family Communication:  Disposition: Home  Status is: Inpatient  Remains inpatient appropriate because:Persistent severe electrolyte disturbances, IV treatments appropriate due to intensity of illness or inability to take PO and Inpatient level of care appropriate due to severity of illness   Dispo: The patient is from: Home  Anticipated d/c is to: Home  Patient currently is not medically stable to d/c.              Difficult to place patient No   Discharge Diagnoses:  Principal Problem:   Acute pancreatitis Active Problems:   Alcohol abuse   Hyponatremia  Discharge Instructions:  Allergies as of 03/21/2020      Reactions   Fluoride Preparations    Seasonal Ic [cholestatin]       Medication List    STOP taking these medications   gabapentin 600 MG tablet Commonly known as: Neurontin   lisinopril 10 MG tablet Commonly known as: ZESTRIL     TAKE these medications   amLODipine 5 MG tablet Commonly known as: NORVASC Take 1 tablet (5 mg total) by mouth daily. What changed: See the new instructions.   aspirin EC 81 MG tablet Take 1 tablet (81 mg total) by mouth daily.   famotidine 20 MG tablet Commonly known as: PEPCID Take 1 tablet (20 mg total) by mouth daily.   folic acid 1 MG tablet Commonly known as: FOLVITE Take 1 tablet (1 mg total) by mouth daily. Start taking on: March 22, 2020   levothyroxine 50 MCG tablet  Commonly known as: SYNTHROID TAKE 1 TABLET(50 MCG) BY MOUTH DAILY BEFORE BREAKFAST What changed: See the new instructions.   metoprolol tartrate 25 MG tablet Commonly known as: LOPRESSOR Take 1 tablet (25 mg total) by mouth 2 (two) times daily.   multivitamin with minerals Tabs tablet Take 1 tablet by mouth daily. Start taking on: March 22, 2020   pantoprazole 40 MG tablet Commonly known as: Protonix Take 1 tablet (40 mg total) by mouth daily.   rosuvastatin 5 MG tablet Commonly known as: CRESTOR Take 1  tablet (5 mg total) by mouth daily.   spironolactone 25 MG tablet Commonly known as: ALDACTONE TAKE 1 TABLET(25 MG) BY MOUTH DAILY What changed: See the new instructions.   thiamine 100 MG tablet Take 1 tablet (100 mg total) by mouth daily. Start taking on: March 22, 2020   Vitamin D (Ergocalciferol) 1.25 MG (50000 UNIT) Caps capsule Commonly known as: DRISDOL Take 1 capsule (50,000 Units total) by mouth every 7 (seven) days. Wednesdays; ONCE A WEEK NOT DAILY!!!       Follow-up Information    Gildardo Pounds, NP. Schedule an appointment as soon as possible for a visit in 1 week(s).   Specialty: Nurse Practitioner Contact information: [address] [place] [address] [phone]              Allergies  Allergen Reactions  . Fluoride Preparations   . Seasonal Ic [Cholestatin]    Allergies as of 03/21/2020      Reactions   Fluoride Preparations    Seasonal Ic [cholestatin]       Medication List    STOP taking these medications   gabapentin 600 MG tablet Commonly known as: Neurontin   lisinopril 10 MG tablet Commonly known as: ZESTRIL     TAKE these medications   amLODipine 5 MG tablet Commonly known as: NORVASC Take 1 tablet (5 mg total) by mouth daily. What changed: See the new instructions.   aspirin EC 81 MG tablet Take 1 tablet (81 mg total) by mouth daily.   famotidine 20 MG tablet Commonly known as: PEPCID Take 1 tablet (20 mg total) by mouth daily.   folic acid 1 MG tablet Commonly known as: FOLVITE Take 1 tablet (1 mg total) by mouth daily. Start taking on: March 22, 2020   levothyroxine 50 MCG tablet Commonly known as: SYNTHROID TAKE 1 TABLET(50 MCG) BY MOUTH DAILY BEFORE BREAKFAST What changed: See the new instructions.   metoprolol tartrate 25 MG tablet Commonly known as: LOPRESSOR Take 1 tablet (25 mg total) by mouth 2 (two) times daily.   multivitamin with minerals Tabs tablet Take 1 tablet by mouth daily. Start taking  on: March 22, 2020   pantoprazole 40 MG tablet Commonly known as: Protonix Take 1 tablet (40 mg total) by mouth daily.   rosuvastatin 5 MG tablet Commonly known as: CRESTOR Take 1 tablet (5 mg total) by mouth daily.   spironolactone 25 MG tablet Commonly known as: ALDACTONE TAKE 1 TABLET(25 MG) BY MOUTH DAILY What changed: See the new instructions.   thiamine 100 MG tablet Take 1 tablet (100 mg total) by mouth daily. Start taking on: March 22, 2020   Vitamin D (Ergocalciferol) 1.25 MG (50000 UNIT) Caps capsule Commonly known as: DRISDOL Take 1 capsule (50,000 Units total) by mouth every 7 (seven) days. Wednesdays; ONCE A WEEK NOT DAILY!!!       Procedures/Studies: CT ABDOMEN PELVIS W CONTRAST  Result Date: 03/18/2020 CLINICAL DATA:  Central abdominal pain EXAM: CT ABDOMEN AND PELVIS WITH CONTRAST TECHNIQUE: Multidetector CT imaging of the abdomen and pelvis was performed using the standard protocol following bolus administration of intravenous contrast. CONTRAST:  123mL OMNIPAQUE IOHEXOL 300 MG/ML  SOLN COMPARISON:  None. FINDINGS: Lower chest: The visualized heart size within normal limits. No pericardial fluid/thickening. No hiatal hernia. Mild bronchus opacity seen at both lung bases. Hepatobiliary: There is diffuse low density seen throughout the liver parenchyma with probable focal fatty sparing seen within the anterior left liver lobe and adjacent to the the gallbladder bed. Main portal vein is patent. No evidence of calcified gallstones, gallbladder wall thickening or biliary dilatation. Pancreas: There is edema and slight enlargement of the pancreatic head with surrounding fat stranding changes. Fat stranding changes extend surrounding the pancreatic body and tail. Tiny calcifications seen within the pancreatic tail. Adjacent to the pancreatic tail there is a 7 mm small hypodense area. Fat stranding changes extend within the right retroperitoneum. Spleen: Normal in size without  focal abnormality. Adrenals/Urinary Tract: Both adrenal glands appear normal. A 2 cm low-density lesion seen within the lower pole of the right kidney. Bladder is unremarkable. Stomach/Bowel: The stomach is normal in appearance. There is inflammatory changes and narrowing seen around the second portion of the duodenum. The remainder of the small bowel is unremarkable. The colon is unremarkable. The appendix is normal. Vascular/Lymphatic: There are no enlarged mesenteric, retroperitoneal, or pelvic lymph nodes. There is a tortuous intra-abdominal aorta and within the infrarenal portion there is focal area of ectasia measuring up to 3.1 cm. Also near the iliac bifurcation there is a focal area ectasia measuring up to 3.1 cm in transverse dimension. Scattered aortic and iliac vasculature calcifications are noted. Reproductive: A small amount of fluid seen within the endometrial canal. Other: No evidence of abdominal wall mass or hernia. Musculoskeletal: No acute or significant osseous findings. IMPRESSION: 1. Findings consistent with acute pancreatitis with surrounding inflammatory changes extending into the retroperitoneum. There is a tiny 7 mm hypodensity adjacent to the pancreatic tail which could represent a small pseudocyst. No definite evidence of pancreatic necrosis 2. Inflammatory changes and mild narrowing seen within the second portion of the duodenum 3. Hepatic steatosis 4. Focal areas of ectasia measuring up to 3.1 cm within the infrarenal portion of the abdominal aorta. Recommend follow-up ultrasound every 3 years. This recommendation follows ACR consensus guidelines: White Paper of the ACR Incidental Findings Committee II on Vascular Findings. J Am Coll Radiol 2013; 10:789-794. Electronically Signed   By: Prudencio Pair M.D.   On: 03/18/2020 23:50      Subjective: Pt tolerating diet well now. Abd pain much better.  Pt agreeable to going home today.  Agrees to avoid alcohol.   Discharge Exam: Vitals:    03/21/20 0042 03/21/20 0525  BP: 117/68 (!) 103/51  Pulse: 87 88  Resp: 16 17  Temp: 99.1 F (37.3 C) 99.5 F (37.5 C)  SpO2: 94% 92%   Vitals:   03/20/20 1614 03/20/20 2029 03/21/20 0042 03/21/20 0525  BP: (!) 100/56 (!) 98/48 117/68 (!) 103/51  Pulse: 94 84 87 88  Resp: 19 16 16 17   Temp: 98.1 F (36.7 C) 99 F (37.2 C) 99.1 F (37.3 C) 99.5 F (37.5 C)  TempSrc: Oral Oral  Oral  SpO2: 96% 95% 94% 92%    General: Pt is alert, awake, not in acute distress Cardiovascular:  normal S1/S2 +, no rubs, no gallops Respiratory: CTA bilaterally, no wheezing, no rhonchi Abdominal:  Soft, NT, ND, bowel sounds + Extremities: no edema, no cyanosis   The results of significant diagnostics from this hospitalization (including imaging, microbiology, ancillary and laboratory) are listed below for reference.     Microbiology: Recent Results (from the past 240 hour(s))  SARS CORONAVIRUS 2 (TAT 6-24 HRS) Nasopharyngeal Nasopharyngeal Swab     Status: None   Collection Time: 03/18/20 10:24 PM   Specimen: Nasopharyngeal Swab  Result Value Ref Range Status   SARS Coronavirus 2 NEGATIVE NEGATIVE Final    Comment: (NOTE) SARS-CoV-2 target nucleic acids are NOT DETECTED.  The SARS-CoV-2 RNA is generally detectable in upper and lower respiratory specimens during the acute phase of infection. Negative results do not preclude SARS-CoV-2 infection, do not rule out co-infections with other pathogens, and should not be used as the sole basis for treatment or other patient management decisions. Negative results must be combined with clinical observations, patient history, and epidemiological information. The expected result is Negative.  Fact Sheet for Patients: SugarRoll.be  Fact Sheet for Healthcare Providers: https://www.woods-mathews.com/  This test is not yet approved or cleared by the [place] FDA and  has been authorized for detection  and/or diagnosis of SARS-CoV-2 by FDA under an Emergency Use Authorization (EUA). This EUA will remain  in effect (meaning this test can be used) for the duration of the COVID-19 declaration under Se ction 564(b)(1) of the Act, 21 U.S.C. section 360bbb-3(b)(1), unless the authorization is terminated or revoked sooner.  Performed at [facility], [place] [address] [place], [place] [address]      Labs: BNP (last 3 results) No results for input(s): BNP in the last 8760 hours. Basic Metabolic Panel: Recent Labs  Lab 03/19/20 0303 03/19/20 0848 03/19/20 1916 03/20/20 0138 03/21/20 0345  NA 125* 126* 125* 126* 130*  K 3.8 3.7 3.6 3.7 3.5  CL 93* 93* 90* 91* 96*  CO2 18* 18* 21* 21* 22  GLUCOSE 143* 145* 173* 149* 108*  BUN <5* <5* <5* <5* <5*  CREATININE 0.64 0.61 0.62 0.61 0.76  CALCIUM 8.7* 8.5* 8.6* 8.7* 8.3*  MG  --   --   --   --  1.5*   Liver Function Tests: Recent Labs  Lab 03/18/20 1835 03/19/20 0303 03/21/20 0345  AST 40 35 30  ALT 98* 82* 46*  ALKPHOS 90 83 75  BILITOT 0.7 0.6 0.9  PROT 6.8 5.9* 4.4*  ALBUMIN 3.7 3.2* 2.2*   Recent Labs  Lab 03/18/20 1835 03/20/20 0138 03/21/20 0345  LIPASE 165* 178* 51   No results for input(s): AMMONIA in the last 168 hours. CBC: Recent Labs  Lab 03/18/20 1835 03/19/20 0303 03/20/20 0138 03/21/20 0345  WBC 5.5 6.7 10.7* 10.6*  HGB 14.1 13.6 12.8 11.3*  HCT 41.8 39.0 37.4 31.3*  MCV 85.1 86.5 84.8 83.2  PLT 266 231 205 145*   Cardiac Enzymes: No results for input(s): CKTOTAL, CKMB, CKMBINDEX, TROPONINI in the last 168 hours. BNP: Invalid input(s): POCBNP CBG: Recent Labs  Lab 03/20/20 1157 03/20/20 1652 03/20/20 2108 03/21/20 0623 03/21/20 1128  GLUCAP 135* 145* 164* 116* 105*   D-Dimer No results for input(s): DDIMER in the last 72 hours. Hgb A1c No results for input(s): HGBA1C in the last 72 hours. Lipid Profile Recent Labs    03/18/20 1835 03/19/20 0303  TRIG 89 70   Thyroid  function studies Recent Labs    03/19/20 0303  TSH 2.510   Anemia work up No results for input(s): VITAMINB12,  FOLATE, FERRITIN, TIBC, IRON, RETICCTPCT in the last 72 hours. Urinalysis    Component Value Date/Time   COLORURINE AMBER (A) 03/18/2020 1835   APPEARANCEUR CLOUDY (A) 03/18/2020 1835   LABSPEC 1.011 03/18/2020 1835   PHURINE 5.0 03/18/2020 1835   GLUCOSEU NEGATIVE 03/18/2020 1835   HGBUR NEGATIVE 03/18/2020 1835   BILIRUBINUR NEGATIVE 03/18/2020 1835   KETONESUR 5 (A) 03/18/2020 1835   PROTEINUR NEGATIVE 03/18/2020 1835   NITRITE POSITIVE (A) 03/18/2020 1835   LEUKOCYTESUR TRACE (A) 03/18/2020 1835   Sepsis Labs Invalid input(s): PROCALCITONIN,  WBC,  LACTICIDVEN Microbiology Recent Results (from the past 240 hour(s))  SARS CORONAVIRUS 2 (TAT 6-24 HRS) Nasopharyngeal Nasopharyngeal Swab     Status: None   Collection Time: 03/18/20 10:24 PM   Specimen: Nasopharyngeal Swab  Result Value Ref Range Status   SARS Coronavirus 2 NEGATIVE NEGATIVE Final    Comment: (NOTE) SARS-CoV-2 target nucleic acids are NOT DETECTED.  The SARS-CoV-2 RNA is generally detectable in upper and lower respiratory specimens during the acute phase of infection. Negative results do not preclude SARS-CoV-2 infection, do not rule out co-infections with other pathogens, and should not be used as the sole basis for treatment or other patient management decisions. Negative results must be combined with clinical observations, patient history, and epidemiological information. The expected result is Negative.  Fact Sheet for Patients: SugarRoll.be  Fact Sheet for Healthcare Providers: https://www.woods-mathews.com/  This test is not yet approved or cleared by the [place] FDA and  has been authorized for detection and/or diagnosis of SARS-CoV-2 by FDA under an Emergency Use Authorization (EUA). This EUA will remain  in effect (meaning this test  can be used) for the duration of the COVID-19 declaration under Se ction 564(b)(1) of the Act, 21 U.S.C. section 360bbb-3(b)(1), unless the authorization is terminated or revoked sooner.  Performed at [facility], [place] [address], [place], [place] [address]     Time coordinating discharge: 37 minutes   SIGNED:  Irwin Brakeman, MD  [facility] 03/21/2020, 11:45 AM How to contact the [facility] Attending or Consulting provider [place] or covering provider during after hours [place], for this patient?  1. Check the care team in [facility] and look for a) attending/consulting [facility] provider listed and b) the [facility] team listed 2. Log into [email or web address] and use [place]'s universal password to access. If you do not have the password, please contact the hospital operator. 3. Locate the [facility] provider you are looking for under [facility] and page to a number that you can be directly reached. 4. If you still have difficulty reaching the provider, please page the [facility] (Director on Call) for the Hospitalists listed on amion for assistance.

## 2020-03-21 NOTE — Discharge Instructions (Signed)
AVOID ALL ALCOHOL CONSUMPTION   Acute Pancreatitis  Acute pancreatitis happens when the pancreas gets swollen. The pancreas is a large gland in the body that helps to control blood sugar. It also makes enzymes that help to digest food. This condition can last a few days and cause serious problems. The lungs, heart, and kidneys may stop working. What are the causes? Causes include:  Alcohol abuse.  Drug abuse.  Gallstones.  A tumor in the pancreas. Other causes include:  Some medicines.  Some chemicals.  Diabetes.  An infection.  Damage caused by an accident.  The poison (venom) from a scorpion bite.  Belly (abdominal) surgery.  The body's defense system (immune system) attacking the pancreas (autoimmune pancreatitis).  Genes that are passed from parent to child (inherited). In some cases, the cause is not known. What are the signs or symptoms?  Pain in the upper belly that may be felt in the back. The pain may be very bad.  Swelling of the belly.  Feeling sick to your stomach (nauseous) and throwing up (vomiting).  Fever. How is this treated? You will likely have to stay in the hospital. Treatment may include:  Pain medicine.  Fluid through an IV tube.  Placing a tube in the stomach to take out the stomach contents. This may help you stop throwing up.  Not eating for 3-4 days.  Antibiotic medicines, if you have an infection.  Treating any other problems that may be the cause.  Steroid medicines, if your problem is caused by your defense system attacking your body's own tissues.  Surgery. Follow these instructions at home: Eating and drinking  Follow instructions from your doctor about what to eat and drink.  Eat foods that do not have a lot of fat in them.  Eat small meals often. Do not eat big meals.  Drink enough fluid to keep your pee (urine) pale yellow.  Do not drink alcohol if it caused your condition.   Medicines  Take  over-the-counter and prescription medicines only as told by your doctor.  Ask your doctor if the medicine prescribed to you: ? Requires you to avoid driving or using heavy machinery. ? Can cause trouble pooping (constipation). You may need to take steps to prevent or treat trouble pooping:  Take over-the-counter or prescription medicines.  Eat foods that are high in fiber. These include beans, whole grains, and fresh fruits and vegetables.  Limit foods that are high in fat and sugar. These include fried or sweet foods. General instructions  Do not use any products that contain nicotine or tobacco, such as cigarettes, e-cigarettes, and chewing tobacco. If you need help quitting, ask your doctor.  Get plenty of rest.  Check your blood sugar at home as told by your doctor.  Keep all follow-up visits as told by your doctor. This is important. Contact a doctor if:  You do not get better as quickly as expected.  You have new symptoms.  Your symptoms get worse.  You have pain or weakness that lasts a long time.  You keep feeling sick to your stomach.  You get better and then you have pain again.  You have a fever. Get help right away if:  You cannot eat or keep fluids down.  Your pain gets very bad.  Your skin or the white part of your eyes turns yellow.  You have sudden swelling in your belly.  You throw up.  You feel dizzy or you pass out (faint).  Your blood sugar is high (over 300 mg/dL). Summary  Acute pancreatitis happens when the pancreas gets swollen.  This condition is often caused by alcohol abuse, drug abuse, or gallstones.  You will likely have to stay in the hospital for treatment. This information is not intended to replace advice given to you by your health care provider. Make sure you discuss any questions you have with your health care provider. Document Revised: 10/25/2017 Document Reviewed: 10/25/2017 Elsevier Patient Education  2021 [place].    IMPORTANT INFORMATION: PAY CLOSE ATTENTION   PHYSICIAN DISCHARGE INSTRUCTIONS  Follow with Primary care provider  Gildardo Pounds, NP  and other consultants as instructed by your Hospitalist Physician  [place] IF SYMPTOMS COME BACK, WORSEN OR NEW PROBLEM DEVELOPS   Please note: You were cared for by a hospitalist during your hospital stay. Every effort will be made to forward records to your primary care provider.  You can request that your primary care provider send for your hospital records if they have not received them.  Once you are discharged, your primary care physician will handle any further medical issues. Please note that NO REFILLS for any discharge medications will be authorized once you are discharged, as it is imperative that you return to your primary care physician (or establish a relationship with a primary care physician if you do not have one) for your post hospital discharge needs so that they can reassess your need for medications and monitor your lab values.  Please get a complete blood count and chemistry panel checked by your Primary MD at your next visit, and again as instructed by your Primary MD.  Get Medicines reviewed and adjusted: Please take all your medications with you for your next visit with your Primary MD  Laboratory/radiological data: Please request your Primary MD to go over all hospital tests and procedure/radiological results at the follow up, please ask your primary care provider to get all Hospital records sent to his/her office.  In some cases, they will be blood work, cultures and biopsy results pending at the time of your discharge. Please request that your primary care provider follow up on these results.  If you are diabetic, please bring your blood sugar readings with you to your follow up appointment with primary care.    Please call and make your follow up appointments as soon as possible.     Also Note the following: If you experience worsening of your admission symptoms, develop shortness of breath, life threatening emergency, suicidal or homicidal thoughts you must seek medical attention immediately by calling 911 or calling your MD immediately  if symptoms less severe.  You must read complete instructions/literature along with all the possible adverse reactions/side effects for all the Medicines you take and that have been prescribed to you. Take any new Medicines after you have completely understood and accpet all the possible adverse reactions/side effects.   Do not drive when taking Pain medications or sleeping medications (Benzodiazepines)  Do not take more than prescribed Pain, Sleep and Anxiety Medications. It is not advisable to combine anxiety,sleep and pain medications without talking with your primary care practitioner  Special Instructions: If you have smoked or chewed Tobacco  in the last 2 yrs please stop smoking, stop any regular Alcohol  and or any Recreational drug use.  Wear Seat belts while driving.  Do not drive if taking any narcotic, mind altering or controlled substances or recreational drugs  or alcohol.

## 2020-03-22 ENCOUNTER — Telehealth: Payer: Self-pay

## 2020-03-22 DIAGNOSIS — R531 Weakness: Secondary | ICD-10-CM | POA: Diagnosis not present

## 2020-03-22 NOTE — Telephone Encounter (Signed)
  Transition Care Management Follow-up Telephone Call  Date of discharge and from where:[facility] 03/21/2020 How have you been since you were released from the hospital? So and so not 100% but feels she is getting a lot better Any questions or concerns? No questions/concerns reported.  Items Reviewed: Did the pt receive and understand the discharge instructions provided? Stated that have the instructions and have no questions.  Medications obtained and verified? She said that they have the medication list  and the hospital staff reviewed them in detail prior to discharge. She said that she has all of the medications and they have no questions.  Any new allergies since your discharge? None reported  Do you have support at home? Yes, cousin Other (ie: DME, Home Health, etc)       Functional Questionnaire: (I = Independent and D = Dependent) ADL's:  Independent.      Follow up appointments reviewed:    PCP Hospital f/u appt confirmed? Has scheduled appt at 2:30pm 03/27/2020 with NP [facility] f/u appt confirmed? None scheduled at this time   Are transportation arrangements needed?  have transportation    If their condition worsens, is the pt aware to call  their PCP or go to the ED? Yes.Made pt aware if condition worsen or start experiencing rapid weight gain, chest pain, diff breathing, SOB, high fevers, or bleading to refer imediately to ED for further evaluation.   Was the patient provided with contact information for the PCP's office or ED? He has the phone number   Was the pt encouraged to call back with questions or concerns?yes

## 2020-03-25 DIAGNOSIS — R531 Weakness: Secondary | ICD-10-CM | POA: Diagnosis not present

## 2020-03-26 DIAGNOSIS — R531 Weakness: Secondary | ICD-10-CM | POA: Diagnosis not present

## 2020-03-27 ENCOUNTER — Other Ambulatory Visit: Payer: Self-pay

## 2020-03-27 ENCOUNTER — Encounter: Payer: Self-pay | Admitting: Nurse Practitioner

## 2020-03-27 ENCOUNTER — Ambulatory Visit: Payer: Medicare HMO | Attending: Nurse Practitioner | Admitting: Nurse Practitioner

## 2020-03-27 VITALS — BP 142/91 | HR 106 | Resp 16 | Ht 66.5 in | Wt 139.8 lb

## 2020-03-27 DIAGNOSIS — I10 Essential (primary) hypertension: Secondary | ICD-10-CM

## 2020-03-27 DIAGNOSIS — Z09 Encounter for follow-up examination after completed treatment for conditions other than malignant neoplasm: Secondary | ICD-10-CM | POA: Diagnosis not present

## 2020-03-27 DIAGNOSIS — R7303 Prediabetes: Secondary | ICD-10-CM

## 2020-03-27 DIAGNOSIS — Z1211 Encounter for screening for malignant neoplasm of colon: Secondary | ICD-10-CM | POA: Diagnosis not present

## 2020-03-27 DIAGNOSIS — Z1231 Encounter for screening mammogram for malignant neoplasm of breast: Secondary | ICD-10-CM | POA: Diagnosis not present

## 2020-03-27 DIAGNOSIS — R69 Illness, unspecified: Secondary | ICD-10-CM | POA: Diagnosis not present

## 2020-03-27 DIAGNOSIS — F17200 Nicotine dependence, unspecified, uncomplicated: Secondary | ICD-10-CM

## 2020-03-27 DIAGNOSIS — I77819 Aortic ectasia, unspecified site: Secondary | ICD-10-CM | POA: Diagnosis not present

## 2020-03-27 DIAGNOSIS — R531 Weakness: Secondary | ICD-10-CM | POA: Diagnosis not present

## 2020-03-27 MED ORDER — AMLODIPINE BESYLATE 5 MG PO TABS: 5.0000 mg | ORAL_TABLET | Freq: Every day | ORAL | 1 refills | Status: AC

## 2020-03-27 MED ORDER — SPIRONOLACTONE 25 MG PO TABS: ORAL_TABLET | ORAL | 1 refills | Status: AC

## 2020-03-27 MED ORDER — METOPROLOL TARTRATE 25 MG PO TABS: 25.0000 mg | ORAL_TABLET | Freq: Two times a day (BID) | ORAL | 1 refills | Status: AC

## 2020-03-27 NOTE — Progress Notes (Signed)
Assessment & Plan:  Jordan Bass was seen today for hospitalization follow-up and blood pressure check.  Diagnoses and all orders for this visit:  Hospital discharge follow-up  Essential hypertension -     amLODipine (NORVASC) 5 MG tablet; Take 1 tablet (5 mg total) by mouth daily. -     metoprolol tartrate (LOPRESSOR) 25 MG tablet; Take 1 tablet (25 mg total) by mouth 2 (two) times daily. -     spironolactone (ALDACTONE) 25 MG tablet; TAKE 1 TABLET(25 MG) BY MOUTH DAILY Continue all antihypertensives as prescribed.  Remember to bring in your blood pressure log with you for your follow up appointment.  DASH/Mediterranean Diets are healthier choices for HTN.    Prediabetes Continue blood sugar control as discussed in office today, low carbohydrate diet, and regular physical exercise as tolerated, 150 minutes per week (30 min each day, 5 days per week, or 50 min 3 days per week).    Aortic ectasia (HCC) -     Ambulatory referral to Cardiothoracic Surgery  Breast cancer screening by mammogram -     MM 3D SCREEN BREAST BILATERAL; Future  Tobacco dependence -     CT CHEST LUNG CA SCREEN LOW DOSE W/O CM; Future  Colon cancer screening -     Ambulatory referral to Gastroenterology    Patient has been counseled on age-appropriate routine health concerns for screening and prevention. These are reviewed and up-to-date. Referrals have been placed accordingly. Immunizations are up-to-date or declined.    Subjective:   Chief Complaint  Patient presents with  . Hospitalization Follow-up  . Blood Pressure Check   HPI Jordan Bass 65 y.o. female presents to office today for HFU and blood pressure check.  She has a past medical history of hypertension, hypothyroidism, EtOH abuse and acute pancreatitis.    HFU Admitted for 3 days with acute pancreatitis and pseudocyst due to ETOH abuse. Hospital course complicated by hyponatremia and dehydration. She required IVFs and bowel rest.  She was discharged home in stable condition with pancreatitis resolved. CT also with findings of 3.1cm ectasia of the infrarenal portion of the abdominal aorta.   States appetite has been poor over the past few months. She also endorses experiencing the deaths of close friends recently.   Essential Hypertension Blood pressure is elevated today. She has been out of several of her medications. Spironolactone 25 mg daily, amlodipine 5 mg daily and lopressor 25 mg BID refilled today. She does continue to smoke and is trying to cut back. Denies chest pain, shortness of breath, palpitations, lightheadedness, dizziness, headaches or BLE edema.  BP Readings from Last 3 Encounters:  03/27/20 (!) 142/91  03/21/20 109/79  10/07/17 112/77    Tobacco Dependence She started smoking at 65 years of age. 26 yr history. Has cut back recently to .5ppd. Will need lung CT scheduled for lung cancer screening.  Prediabetes Currently controlled with diet. LDL not at goal. Will refill crestor 5 mg daily.  Lab Results  Component Value Date   HGBA1C 6.2 (H) 02/22/2020   Lab Results  Component Value Date   LDLCALC 111 (H) 02/22/2020   Hypothyroidism Taking synthroid 50 mg daily as prescribed. She denies change in energy level, diarrhea, heat / cold intolerance, nervousness and palpitations.  Lab Results  Component Value Date   TSH 2.510 03/19/2020     Review of Systems  Constitutional: Negative for fever, malaise/fatigue and weight loss.  HENT: Negative.  Negative for nosebleeds.   Eyes: Negative.  Negative for blurred vision, double vision and photophobia.  Respiratory: Negative.  Negative for cough and shortness of breath.   Cardiovascular: Negative.  Negative for chest pain, palpitations and leg swelling.  Gastrointestinal: Negative.  Negative for heartburn, nausea and vomiting.  Musculoskeletal: Negative.  Negative for myalgias.  Neurological: Negative.  Negative for dizziness, focal weakness,  seizures and headaches.  Psychiatric/Behavioral: Positive for substance abuse (etoh abuse). Negative for suicidal ideas.    Past Medical History:  Diagnosis Date  . Abdominal pain 03/18/2020  . Anemia   . Arthritis   . Bilateral calcaneal spurs    heel spurs  . Hyperlipidemia   . Hypertension   . Stroke ([place]) 2016   weakness on left side/uses cane/in coma for 45 days  . Thyroid disease   . Weakness    left side weakness due to stroke/uses cane    Past Surgical History:  Procedure Laterality Date  . COLONOSCOPY     over 10 years ago in [place]  . HAND SURGERY  1987   right hand surgery due to caught in bacon machine/ use ligament from stomach to repair your hand    Family History  Problem Relation Age of Onset  . Arthritis Mother   . Cancer Mother   . Diabetes Mother   . Heart disease Mother   . Hyperlipidemia Mother   . Hypertension Mother   . Stroke Father   . Cancer Father   . Arthritis Father   . Heart disease Sister   . Arthritis Brother   . Hypertension Brother   . Healthy Sister   . Hypertension Brother   . Hypertension Brother   . Hypertension Brother   . Pneumonia Sister   . Pneumonia Sister   . Pneumonia Brother   . Pneumonia Brother   . Heart disease Brother   . AAA (abdominal aortic aneurysm) Brother   . Colon polyps Neg Hx   . Esophageal cancer Neg Hx   . Rectal cancer Neg Hx   . Stomach cancer Neg Hx     Social History Reviewed with no changes to be made today.   Outpatient Medications Prior to Visit  Medication Sig Dispense Refill  . aspirin EC 81 MG tablet Take 1 tablet (81 mg total) by mouth daily. 90 tablet 3  . famotidine (PEPCID) 20 MG tablet Take 1 tablet (20 mg total) by mouth daily. 90 tablet 1  . folic acid (FOLVITE) 1 MG tablet Take 1 tablet (1 mg total) by mouth daily. 30 tablet 1  . levothyroxine (SYNTHROID) 50 MCG tablet TAKE 1 TABLET(50 MCG) BY MOUTH DAILY BEFORE BREAKFAST (Patient taking differently: Take 50 mcg by mouth  daily before breakfast.) 90 tablet 0  . Multiple Vitamin (MULTIVITAMIN WITH MINERALS) TABS tablet Take 1 tablet by mouth daily.    . pantoprazole (PROTONIX) 40 MG tablet Take 1 tablet (40 mg total) by mouth daily. 30 tablet 1  . rosuvastatin (CRESTOR) 5 MG tablet Take 1 tablet (5 mg total) by mouth daily. 90 tablet 2  . thiamine 100 MG tablet Take 1 tablet (100 mg total) by mouth daily. 30 tablet 1  . Vitamin D, Ergocalciferol, (DRISDOL) 1.25 MG (50000 UNIT) CAPS capsule Take 1 capsule (50,000 Units total) by mouth every 7 (seven) days. Wednesdays; ONCE A WEEK NOT DAILY!!!    . amLODipine (NORVASC) 5 MG tablet Take 1 tablet (5 mg total) by mouth daily.    . metoprolol tartrate (LOPRESSOR) 25 MG tablet Take 1 tablet (25  mg total) by mouth 2 (two) times daily. 180 tablet 1  . spironolactone (ALDACTONE) 25 MG tablet TAKE 1 TABLET(25 MG) BY MOUTH DAILY (Patient taking differently: Take 25 mg by mouth daily.) 90 tablet 0   No facility-administered medications prior to visit.    Allergies  Allergen Reactions  . Fluoride Preparations   . Seasonal Ic [Cholestatin]        Objective:    BP (!) 142/91   Pulse (!) 106   Resp 16   Ht 5' 6.5" (1.689 m)   Wt 139 lb 12.8 oz (63.4 kg)   SpO2 96%   BMI 22.23 kg/m  Wt Readings from Last 3 Encounters:  03/27/20 139 lb 12.8 oz (63.4 kg)  02/19/20 142 lb (64.4 kg)  10/07/17 158 lb (71.7 kg)    Physical Exam Vitals and nursing note reviewed.  Constitutional:      Appearance: She is well-developed.  HENT:     Head: Normocephalic and atraumatic.  Cardiovascular:     Rate and Rhythm: Normal rate and regular rhythm.     Heart sounds: Normal heart sounds. No murmur heard. No friction rub. No gallop.   Pulmonary:     Effort: Pulmonary effort is normal. No tachypnea or respiratory distress.     Breath sounds: Normal breath sounds. No decreased breath sounds, wheezing, rhonchi or rales.  Chest:     Chest wall: No tenderness.  Abdominal:      General: Bowel sounds are normal.     Palpations: Abdomen is soft.  Musculoskeletal:        General: Normal range of motion.     Cervical back: Normal range of motion.  Skin:    General: Skin is warm and dry.  Neurological:     Mental Status: She is alert and oriented to person, place, and time.     Coordination: Coordination normal.  Psychiatric:        Behavior: Behavior normal. Behavior is cooperative.        Thought Content: Thought content normal.        Judgment: Judgment normal.          Patient has been counseled extensively about nutrition and exercise as well as the importance of adherence with medications and regular follow-up. The patient was given clear instructions to go to ER or return to medical center if symptoms don't improve, worsen or new problems develop. The patient verbalized understanding.   Follow-up: Return for 2 weeks blood work. See me for PAP .   Gildardo Pounds, FNP-BC [facility] [place], [place]   03/30/2020, 9:48 PM

## 2020-03-28 DIAGNOSIS — R531 Weakness: Secondary | ICD-10-CM | POA: Diagnosis not present

## 2020-03-29 DIAGNOSIS — R531 Weakness: Secondary | ICD-10-CM | POA: Diagnosis not present

## 2020-03-30 ENCOUNTER — Encounter: Payer: Self-pay | Admitting: Nurse Practitioner

## 2020-03-31 ENCOUNTER — Other Ambulatory Visit: Payer: Self-pay | Admitting: Nurse Practitioner

## 2020-03-31 DIAGNOSIS — I77819 Aortic ectasia, unspecified site: Secondary | ICD-10-CM

## 2020-04-02 DIAGNOSIS — R531 Weakness: Secondary | ICD-10-CM | POA: Diagnosis not present

## 2020-04-03 DIAGNOSIS — R531 Weakness: Secondary | ICD-10-CM | POA: Diagnosis not present

## 2020-04-04 DIAGNOSIS — R531 Weakness: Secondary | ICD-10-CM | POA: Diagnosis not present

## 2020-04-05 ENCOUNTER — Ambulatory Visit (HOSPITAL_BASED_OUTPATIENT_CLINIC_OR_DEPARTMENT_OTHER): Admission: RE | Admit: 2020-04-05 | Payer: Medicare HMO | Source: Ambulatory Visit

## 2020-04-08 DIAGNOSIS — R531 Weakness: Secondary | ICD-10-CM | POA: Diagnosis not present

## 2020-04-09 DIAGNOSIS — R531 Weakness: Secondary | ICD-10-CM | POA: Diagnosis not present

## 2020-04-10 DIAGNOSIS — R531 Weakness: Secondary | ICD-10-CM | POA: Diagnosis not present

## 2020-04-11 DIAGNOSIS — R531 Weakness: Secondary | ICD-10-CM | POA: Diagnosis not present

## 2020-04-12 ENCOUNTER — Ambulatory Visit (HOSPITAL_BASED_OUTPATIENT_CLINIC_OR_DEPARTMENT_OTHER): Admission: RE | Admit: 2020-04-12 | Payer: Medicare HMO | Source: Ambulatory Visit

## 2020-04-12 ENCOUNTER — Other Ambulatory Visit: Payer: Self-pay

## 2020-04-12 ENCOUNTER — Ambulatory Visit (HOSPITAL_BASED_OUTPATIENT_CLINIC_OR_DEPARTMENT_OTHER)
Admission: RE | Admit: 2020-04-12 | Discharge: 2020-04-12 | Disposition: A | Discharge status: Home / Self Care | Payer: Medicare HMO | Source: Ambulatory Visit | Attending: Nurse Practitioner | Admitting: Nurse Practitioner

## 2020-04-12 ENCOUNTER — Encounter (HOSPITAL_BASED_OUTPATIENT_CLINIC_OR_DEPARTMENT_OTHER): Payer: Self-pay

## 2020-04-12 DIAGNOSIS — R531 Weakness: Secondary | ICD-10-CM | POA: Diagnosis not present

## 2020-04-12 DIAGNOSIS — R69 Illness, unspecified: Secondary | ICD-10-CM | POA: Diagnosis not present

## 2020-04-12 DIAGNOSIS — F17210 Nicotine dependence, cigarettes, uncomplicated: Secondary | ICD-10-CM | POA: Diagnosis present

## 2020-04-12 DIAGNOSIS — F17200 Nicotine dependence, unspecified, uncomplicated: Secondary | ICD-10-CM

## 2020-04-15 DIAGNOSIS — R531 Weakness: Secondary | ICD-10-CM | POA: Diagnosis not present

## 2020-04-16 ENCOUNTER — Encounter: Payer: Medicare HMO | Admitting: Vascular Surgery

## 2020-04-16 DIAGNOSIS — R531 Weakness: Secondary | ICD-10-CM | POA: Diagnosis not present

## 2020-04-17 ENCOUNTER — Other Ambulatory Visit: Payer: Self-pay | Admitting: Nurse Practitioner

## 2020-04-17 DIAGNOSIS — E039 Hypothyroidism, unspecified: Secondary | ICD-10-CM

## 2020-04-17 DIAGNOSIS — R531 Weakness: Secondary | ICD-10-CM | POA: Diagnosis not present

## 2020-04-18 DIAGNOSIS — R531 Weakness: Secondary | ICD-10-CM | POA: Diagnosis not present

## 2020-04-23 ENCOUNTER — Other Ambulatory Visit: Payer: Self-pay

## 2020-04-23 ENCOUNTER — Ambulatory Visit (INDEPENDENT_AMBULATORY_CARE_PROVIDER_SITE_OTHER): Payer: Medicare HMO | Admitting: Vascular Surgery

## 2020-04-23 ENCOUNTER — Encounter: Payer: Self-pay | Admitting: Vascular Surgery

## 2020-04-23 DIAGNOSIS — I77819 Aortic ectasia, unspecified site: Secondary | ICD-10-CM | POA: Diagnosis not present

## 2020-04-23 NOTE — Progress Notes (Signed)
Patient name: Jordan Bass MRN: [id number] DOB: 10-15-1955 Sex: female  REASON FOR CONSULT: Evaluate aortic ectasia  HPI: Jordan Bass is a 65 y.o. female, with history of hypertension, previous stroke with some left-sided weakness, tobacco abuse who presents for evaluation of aortic ectasia.  Patient states she recently had a CT scan on 03/18/20 and this confirmed pancreatitis.  As part of that CT there was incidental finding of aortic ectasia of the infrarenal aorta that measured 3.1 cm.  She has no prior knowledge of aneurysm disease.  She states she does smoke about a pack every 3 days.  No previous abdominal surgery.  No significant abdominal pain today.  Past Medical History:  Diagnosis Date  . Abdominal pain 03/18/2020  . Anemia   . Arthritis   . Bilateral calcaneal spurs    heel spurs  . Hyperlipidemia   . Hypertension   . Stroke ([place]) 2016   weakness on left side/uses cane/in coma for 45 days  . Thyroid disease   . Weakness    left side weakness due to stroke/uses cane    Past Surgical History:  Procedure Laterality Date  . COLONOSCOPY     over 10 years ago in [place]  . HAND SURGERY  1987   right hand surgery due to caught in bacon machine/ use ligament from stomach to repair your hand    Family History  Problem Relation Age of Onset  . Arthritis Mother   . Cancer Mother   . Diabetes Mother   . Heart disease Mother   . Hyperlipidemia Mother   . Hypertension Mother   . Stroke Father   . Cancer Father   . Arthritis Father   . Heart disease Sister   . Arthritis Brother   . Hypertension Brother   . Healthy Sister   . Hypertension Brother   . Hypertension Brother   . Hypertension Brother   . Pneumonia Sister   . Pneumonia Sister   . Pneumonia Brother   . Pneumonia Brother   . Heart disease Brother   . AAA (abdominal aortic aneurysm) Brother   . Colon polyps Neg Hx   . Esophageal cancer Neg Hx   . Rectal cancer Neg Hx   . Stomach cancer  Neg Hx     SOCIAL HISTORY: Social History   Socioeconomic History  . Marital status: Widowed    Spouse name: Not on file  . Number of children: Not on file  . Years of education: Not on file  . Highest education level: 12th grade  Occupational History  . Not on file  Tobacco Use  . Smoking status: Current Every Day Smoker    Packs/day: 0.33    Years: 40.00    Pack years: 13.20    Types: Cigarettes  . Smokeless tobacco: Never Used  Vaping Use  . Vaping Use: Unknown  Substance and Sexual Activity  . Alcohol use: Yes    Alcohol/week: 7.0 standard drinks    Types: 7 Cans of beer per week    Comment: beer daily  . Drug use: Never  . Sexual activity: Not Currently  Other Topics Concern  . Not on file  Social History Narrative  . Not on file   Social Determinants of Health   Financial Resource Strain: Not on file  Food Insecurity: Not on file  Transportation Needs: Not on file  Physical Activity: Not on file  Stress: Not on file  Social Connections: Not on file  Intimate Partner Violence: Not on file    Allergies  Allergen Reactions  . Fluoride Preparations   . Seasonal Ic [Cholestatin]     Current Outpatient Medications  Medication Sig Dispense Refill  . amLODipine (NORVASC) 5 MG tablet Take 1 tablet (5 mg total) by mouth daily. 90 tablet 1  . aspirin EC 81 MG tablet Take 1 tablet (81 mg total) by mouth daily. 90 tablet 3  . famotidine (PEPCID) 20 MG tablet Take 1 tablet (20 mg total) by mouth daily. 90 tablet 1  . folic acid (FOLVITE) 1 MG tablet Take 1 tablet (1 mg total) by mouth daily. 30 tablet 1  . levothyroxine (SYNTHROID) 50 MCG tablet TAKE 1 TABLET(50 MCG) BY MOUTH DAILY BEFORE BREAKFAST (Patient taking differently: Take 50 mcg by mouth daily before breakfast.) 90 tablet 0  . metoprolol tartrate (LOPRESSOR) 25 MG tablet Take 1 tablet (25 mg total) by mouth 2 (two) times daily. 180 tablet 1  . Multiple Vitamin (MULTIVITAMIN WITH MINERALS) TABS tablet Take  1 tablet by mouth daily.    . pantoprazole (PROTONIX) 40 MG tablet Take 1 tablet (40 mg total) by mouth daily. 30 tablet 1  . rosuvastatin (CRESTOR) 5 MG tablet Take 1 tablet (5 mg total) by mouth daily. 90 tablet 2  . spironolactone (ALDACTONE) 25 MG tablet TAKE 1 TABLET(25 MG) BY MOUTH DAILY 90 tablet 1  . thiamine 100 MG tablet Take 1 tablet (100 mg total) by mouth daily. 30 tablet 1  . Vitamin D, Ergocalciferol, (DRISDOL) 1.25 MG (50000 UNIT) CAPS capsule Take 1 capsule (50,000 Units total) by mouth every 7 (seven) days. Wednesdays; ONCE A WEEK NOT DAILY!!!     No current facility-administered medications for this visit.    REVIEW OF SYSTEMS:  [X]  denotes positive finding, [ ]  denotes negative finding Cardiac  Comments:  Chest pain or chest pressure:    Shortness of breath upon exertion:    Short of breath when lying flat: x   Irregular heart rhythm:        Vascular    Pain in calf, thigh, or hip brought on by ambulation:    Pain in feet at night that wakes you up from your sleep:     Blood clot in your veins:    Leg swelling:  x       Pulmonary    Oxygen at home:    Productive cough:     Wheezing:         Neurologic    Sudden weakness in arms or legs:     Sudden numbness in arms or legs:     Sudden onset of difficulty speaking or slurred speech:    Temporary loss of vision in one eye:     Problems with dizziness:         Gastrointestinal    Blood in stool:     Vomited blood:         Genitourinary    Burning when urinating:     Blood in urine:        Psychiatric    Major depression:         Hematologic    Bleeding problems:    Problems with blood clotting too easily:        Skin    Rashes or ulcers:        Constitutional    Fever or chills:      PHYSICAL EXAM: Vitals:   04/23/20 1122  BP: 127/83  Pulse: 84  Resp: 14  Temp: 97.7 F (36.5 C)  TempSrc: Temporal  SpO2: 98%  Weight: 139 lb (63 kg)  Height: 5\' 6"  (1.676 m)    GENERAL: The patient  is a well-nourished female, in no acute distress. The vital signs are documented above. CARDIAC: There is a regular rate and rhythm.  VASCULAR:  Palpable femoral pulses bilaterally Palpable PT pulses bilaterally and AT pulses 2+ lower extremity edema PULMONARY: No respiratory distress. ABDOMEN: Soft and non-tender. MUSCULOSKELETAL: There are no major deformities or cyanosis. NEUROLOGIC: No focal weakness or paresthesias are detected. SKIN: There are no ulcers or rashes noted. PSYCHIATRIC: The patient has a normal affect.  DATA:   Reviewed her CT abdomen pelvis from 03/18/2020 and it does show some ectasia of the infrarenal aorta measuring 3.1 cm and there is also some ectasia of the visceral segment of the abdominal aorta.  Assessment/Plan:  65 year old female who presents for evaluation of ectasia of her infrarenal abdominal aorta measuring 3.1 cm on CT from 03/18/2020.  Discussed that this is an incidental finding and that at the size of 3.1 cm there is no indication for surgical intervention.  Discussed that certainly over time there is risk that this could continue to degenerate into a larger aneurysm.  We discussed aneurysm disease in detail.  She has a little bit of abnormal dilation of her visceral segment of her abdominal aorta as well.  I recommended the importance of smoking cessation and blood pressure control etc.  Discussed that I will have her follow-up again in 3 years in the [facility] for a AAA duplex for appropriate surveillance.   Marty Heck, MD [facility] Office: [phone]

## 2020-04-25 DIAGNOSIS — R531 Weakness: Secondary | ICD-10-CM | POA: Diagnosis not present

## 2020-04-26 DIAGNOSIS — R531 Weakness: Secondary | ICD-10-CM | POA: Diagnosis not present

## 2020-04-29 DIAGNOSIS — R531 Weakness: Secondary | ICD-10-CM | POA: Diagnosis not present

## 2020-04-30 DIAGNOSIS — R531 Weakness: Secondary | ICD-10-CM | POA: Diagnosis not present

## 2020-05-01 DIAGNOSIS — R531 Weakness: Secondary | ICD-10-CM | POA: Diagnosis not present

## 2020-05-02 DIAGNOSIS — R531 Weakness: Secondary | ICD-10-CM | POA: Diagnosis not present

## 2020-05-03 DIAGNOSIS — R531 Weakness: Secondary | ICD-10-CM | POA: Diagnosis not present

## 2020-05-06 DIAGNOSIS — R531 Weakness: Secondary | ICD-10-CM | POA: Diagnosis not present

## 2020-05-07 DIAGNOSIS — R531 Weakness: Secondary | ICD-10-CM | POA: Diagnosis not present

## 2020-05-08 DIAGNOSIS — R531 Weakness: Secondary | ICD-10-CM | POA: Diagnosis not present

## 2020-05-09 DIAGNOSIS — R531 Weakness: Secondary | ICD-10-CM | POA: Diagnosis not present

## 2020-05-10 DIAGNOSIS — R531 Weakness: Secondary | ICD-10-CM | POA: Diagnosis not present

## 2020-05-13 DIAGNOSIS — R531 Weakness: Secondary | ICD-10-CM | POA: Diagnosis not present

## 2020-05-14 DIAGNOSIS — R531 Weakness: Secondary | ICD-10-CM | POA: Diagnosis not present

## 2020-05-15 DIAGNOSIS — R531 Weakness: Secondary | ICD-10-CM | POA: Diagnosis not present

## 2020-05-16 DIAGNOSIS — R531 Weakness: Secondary | ICD-10-CM | POA: Diagnosis not present

## 2020-05-20 ENCOUNTER — Other Ambulatory Visit: Payer: Self-pay | Admitting: Nurse Practitioner

## 2020-05-20 DIAGNOSIS — R531 Weakness: Secondary | ICD-10-CM | POA: Diagnosis not present

## 2020-05-20 DIAGNOSIS — Z1231 Encounter for screening mammogram for malignant neoplasm of breast: Secondary | ICD-10-CM

## 2020-05-21 DIAGNOSIS — R531 Weakness: Secondary | ICD-10-CM | POA: Diagnosis not present

## 2020-05-22 DIAGNOSIS — R531 Weakness: Secondary | ICD-10-CM | POA: Diagnosis not present

## 2020-05-23 DIAGNOSIS — R531 Weakness: Secondary | ICD-10-CM | POA: Diagnosis not present

## 2020-05-24 DIAGNOSIS — R531 Weakness: Secondary | ICD-10-CM | POA: Diagnosis not present

## 2020-05-27 DIAGNOSIS — R531 Weakness: Secondary | ICD-10-CM | POA: Diagnosis not present

## 2020-05-28 ENCOUNTER — Ambulatory Visit
Admission: RE | Admit: 2020-05-28 | Discharge: 2020-05-28 | Disposition: A | Discharge status: Home / Self Care | Payer: Medicare HMO | Source: Ambulatory Visit | Attending: Nurse Practitioner | Admitting: Nurse Practitioner

## 2020-05-28 ENCOUNTER — Other Ambulatory Visit: Payer: Self-pay

## 2020-05-28 DIAGNOSIS — Z1231 Encounter for screening mammogram for malignant neoplasm of breast: Secondary | ICD-10-CM | POA: Diagnosis not present

## 2020-05-28 DIAGNOSIS — R531 Weakness: Secondary | ICD-10-CM | POA: Diagnosis not present

## 2020-05-29 DIAGNOSIS — R531 Weakness: Secondary | ICD-10-CM | POA: Diagnosis not present

## 2020-05-30 DIAGNOSIS — R531 Weakness: Secondary | ICD-10-CM | POA: Diagnosis not present

## 2020-05-31 DIAGNOSIS — R531 Weakness: Secondary | ICD-10-CM | POA: Diagnosis not present

## 2020-06-03 DIAGNOSIS — R531 Weakness: Secondary | ICD-10-CM | POA: Diagnosis not present

## 2020-06-04 DIAGNOSIS — R531 Weakness: Secondary | ICD-10-CM | POA: Diagnosis not present

## 2020-06-05 DIAGNOSIS — R531 Weakness: Secondary | ICD-10-CM | POA: Diagnosis not present

## 2020-06-06 DIAGNOSIS — R531 Weakness: Secondary | ICD-10-CM | POA: Diagnosis not present

## 2020-06-07 DIAGNOSIS — R531 Weakness: Secondary | ICD-10-CM | POA: Diagnosis not present

## 2020-06-10 DIAGNOSIS — R531 Weakness: Secondary | ICD-10-CM | POA: Diagnosis not present

## 2020-06-11 DIAGNOSIS — R531 Weakness: Secondary | ICD-10-CM | POA: Diagnosis not present

## 2020-06-12 DIAGNOSIS — R531 Weakness: Secondary | ICD-10-CM | POA: Diagnosis not present

## 2020-06-13 DIAGNOSIS — R531 Weakness: Secondary | ICD-10-CM | POA: Diagnosis not present

## 2020-06-19 DIAGNOSIS — R531 Weakness: Secondary | ICD-10-CM | POA: Diagnosis not present

## 2020-06-21 DIAGNOSIS — R531 Weakness: Secondary | ICD-10-CM | POA: Diagnosis not present

## 2020-06-24 DIAGNOSIS — R531 Weakness: Secondary | ICD-10-CM | POA: Diagnosis not present

## 2020-06-25 DIAGNOSIS — R531 Weakness: Secondary | ICD-10-CM | POA: Diagnosis not present

## 2020-06-26 DIAGNOSIS — R531 Weakness: Secondary | ICD-10-CM | POA: Diagnosis not present

## 2020-06-27 DIAGNOSIS — R531 Weakness: Secondary | ICD-10-CM | POA: Diagnosis not present

## 2020-06-28 DIAGNOSIS — R531 Weakness: Secondary | ICD-10-CM | POA: Diagnosis not present

## 2020-07-01 ENCOUNTER — Encounter: Payer: Self-pay | Admitting: Nurse Practitioner

## 2020-07-01 ENCOUNTER — Ambulatory Visit: Payer: Medicare HMO | Attending: Nurse Practitioner | Admitting: Nurse Practitioner

## 2020-07-01 ENCOUNTER — Other Ambulatory Visit: Payer: Self-pay

## 2020-07-01 ENCOUNTER — Other Ambulatory Visit: Payer: Self-pay | Admitting: Nurse Practitioner

## 2020-07-01 VITALS — BP 132/89 | HR 99 | Temp 99.7°F | Resp 16 | Ht 66.0 in | Wt 127.0 lb

## 2020-07-01 DIAGNOSIS — J449 Chronic obstructive pulmonary disease, unspecified: Secondary | ICD-10-CM

## 2020-07-01 DIAGNOSIS — Z8719 Personal history of other diseases of the digestive system: Secondary | ICD-10-CM | POA: Diagnosis not present

## 2020-07-01 DIAGNOSIS — D649 Anemia, unspecified: Secondary | ICD-10-CM | POA: Diagnosis not present

## 2020-07-01 DIAGNOSIS — R531 Weakness: Secondary | ICD-10-CM | POA: Diagnosis not present

## 2020-07-01 DIAGNOSIS — I10 Essential (primary) hypertension: Secondary | ICD-10-CM

## 2020-07-01 DIAGNOSIS — E871 Hypo-osmolality and hyponatremia: Secondary | ICD-10-CM | POA: Diagnosis not present

## 2020-07-01 DIAGNOSIS — L299 Pruritus, unspecified: Secondary | ICD-10-CM | POA: Diagnosis not present

## 2020-07-01 DIAGNOSIS — Z23 Encounter for immunization: Secondary | ICD-10-CM | POA: Diagnosis not present

## 2020-07-01 DIAGNOSIS — Z1211 Encounter for screening for malignant neoplasm of colon: Secondary | ICD-10-CM

## 2020-07-01 DIAGNOSIS — R7303 Prediabetes: Secondary | ICD-10-CM

## 2020-07-01 DIAGNOSIS — R64 Cachexia: Secondary | ICD-10-CM

## 2020-07-01 MED ORDER — HYDROXYZINE HCL 10 MG PO TABS: 5.0000 mg | ORAL_TABLET | Freq: Every day | ORAL | 0 refills | Status: AC

## 2020-07-01 MED ORDER — ALBUTEROL SULFATE HFA 108 (90 BASE) MCG/ACT IN AERS: 2.0000 | INHALATION_SPRAY | Freq: Four times a day (QID) | RESPIRATORY_TRACT | 1 refills | Status: DC | PRN

## 2020-07-01 NOTE — Telephone Encounter (Signed)
Requested medication (s) are due for refill today: No  Requested medication (s) are on the active medication list: Yes  Last refill:  07/01/20  Future visit scheduled: No  Notes to clinic:  Requesting 90 day supply.    Requested Prescriptions  Pending Prescriptions Disp Refills   albuterol (VENTOLIN HFA) 108 (90 Base) MCG/ACT inhaler [Pharmacy Med Name: ALBUTEROL HFA INH(200 PUFFS)18GM] 54 g     Sig: INHALE 2 PUFFS INTO THE LUNGS EVERY 6 HOURS AS NEEDED FOR WHEEZING OR SHORTNESS OF BREATH      Pulmonology:  [facility]eta Agonists Failed - 07/01/2020 11:30 AM      Failed - One inhaler should last at least one month. If the patient is requesting refills earlier, contact the patient to check for uncontrolled symptoms.      Passed - Valid encounter within last 12 months    Recent Outpatient Visits           Today Chronic obstructive pulmonary disease, unspecified COPD type [facility])   [place], Vernia Buff, NP   3 months ago Hospital discharge follow-up   [place], NP   4 months ago Essential hypertension   [place], Zelda W, NP       Future Appointments             In 1 month Gildardo Pounds, NP [place]

## 2020-07-01 NOTE — Progress Notes (Signed)
Assessment & Plan:  Jordan Bass was seen today for follow-up.  Diagnoses and all orders for this visit:  Chronic obstructive pulmonary disease, unspecified COPD type ([place]) -     albuterol (VENTOLIN HFA) 108 (90 Base) MCG/ACT inhaler; Inhale 2 puffs into the lungs every 6 (six) hours as needed for wheezing or shortness of breath. -     Pneumococcal conjugate vaccine 20-valent (Prevnar 20)  Primary hypertension -     Basic metabolic panel Continue all antihypertensives as prescribed.  Remember to bring in your blood pressure log with you for your follow up appointment.  DASH/Mediterranean Diets are healthier choices for HTN.    Prediabetes -     Hemoglobin A1c   Anemia, unspecified type -     CBC with Differential -     Ambulatory referral to Gastroenterology  [facility]) -     Ambulatory referral to Gastroenterology -     CA 125 -     Cancer Antigen 19-9  Hx of pancreatitis -     CA 125 -     Cancer Antigen 19-9  Hyponatremia -     CA 125 -     Cancer Antigen 19-9  Colon cancer screening -     Ambulatory referral to Gastroenterology Anemia Abnormal Colonoscopy in 2019. Instructions for repeat colo in 2022  Need for zoster vaccine -     Varicella-zoster vaccine IM (Shingrix)  Pruritic condition -     hydrOXYzine (ATARAX/VISTARIL) 10 MG tablet; Take 0.5 tablets (5 mg total) by mouth at bedtime.   Patient has been counseled on age-appropriate routine health concerns for screening and prevention. These are reviewed and up-to-date. Referrals have been placed accordingly. Immunizations are up-to-date or declined.    Subjective:   Chief Complaint  Patient presents with   Follow-up    HPI Jordan Bass 65 y.o. female presents to office today for follow-up.  She has complaints of itching on her back which is worse at night.  Also reports decreased appetite and unintentional weight loss.  She does have a history of EtOH abuse, pancreatitis and is a daily smoker.   She has lost 12 pounds since March.  COPD Auscultatory wheezing on exam.  We will order SABA today.  Primary HTN Blood pressure well controlled today she endorses medication adherence taking amlodipine 5 mg daily, spironolactone 25 mg daily and metoprolol 25 mg twice daily.  Heart rate is elevated today. Denies chest pain, shortness of breath, palpitations, lightheadedness, dizziness, headaches or BLE edema.   BP Readings from Last 3 Encounters:  07/01/20 132/89  04/23/20 127/83  03/27/20 (!) 142/91    Prediabetes Well-controlled without the use of any oral antidiabetic medications. Lab Results  Component Value Date   HGBA1C 6.2 (H) 02/22/2020    Review of Systems  Constitutional:  Positive for malaise/fatigue and weight loss. Negative for fever.       Decreased appetite  HENT: Negative.  Negative for nosebleeds.   Eyes: Negative.  Negative for blurred vision, double vision and photophobia.  Respiratory: Negative.  Negative for cough and shortness of breath.   Cardiovascular: Negative.  Negative for chest pain, palpitations and leg swelling.  Gastrointestinal: Negative.  Negative for heartburn, nausea and vomiting.  Musculoskeletal: Negative.  Negative for myalgias.  Skin:  Positive for itching. Negative for rash.  Neurological: Negative.  Negative for dizziness, focal weakness, seizures and headaches.  Psychiatric/Behavioral:  Positive for substance abuse (EtOH abuse). Negative for suicidal ideas.  Past Medical History:  Diagnosis Date   Abdominal pain 03/18/2020   Anemia    Arthritis    Bilateral calcaneal spurs    heel spurs   Hyperlipidemia    Hypertension    Stroke ([place]) 2016   weakness on left side/uses cane/in coma for 45 days   Thyroid disease    Weakness    left side weakness due to stroke/uses cane    Past Surgical History:  Procedure Laterality Date   COLONOSCOPY     over 10 years ago in [place]   right hand surgery due to  caught in bacon machine/ use ligament from stomach to repair your hand    Family History  Problem Relation Age of Onset   Arthritis Mother    Cancer Mother    Diabetes Mother    Heart disease Mother    Hyperlipidemia Mother    Hypertension Mother    Stroke Father    Cancer Father    Arthritis Father    Heart disease Sister    Arthritis Brother    Hypertension Brother    Healthy Sister    Hypertension Brother    Hypertension Brother    Hypertension Brother    Pneumonia Sister    Pneumonia Sister    Pneumonia Brother    Pneumonia Brother    Heart disease Brother    AAA (abdominal aortic aneurysm) Brother    Colon polyps Neg Hx    Esophageal cancer Neg Hx    Rectal cancer Neg Hx    Stomach cancer Neg Hx     Social History Reviewed with no changes to be made today.   Outpatient Medications Prior to Visit  Medication Sig Dispense Refill   amLODipine (NORVASC) 5 MG tablet Take 1 tablet (5 mg total) by mouth daily. 90 tablet 1   famotidine (PEPCID) 20 MG tablet Take 1 tablet (20 mg total) by mouth daily. 90 tablet 1   folic acid (FOLVITE) 1 MG tablet Take 1 tablet (1 mg total) by mouth daily. 30 tablet 1   levothyroxine (SYNTHROID) 50 MCG tablet TAKE 1 TABLET(50 MCG) BY MOUTH DAILY BEFORE BREAKFAST (Patient taking differently: Take 50 mcg by mouth daily before breakfast.) 90 tablet 0   metoprolol tartrate (LOPRESSOR) 25 MG tablet Take 1 tablet (25 mg total) by mouth 2 (two) times daily. 180 tablet 1   Multiple Vitamin (MULTIVITAMIN WITH MINERALS) TABS tablet Take 1 tablet by mouth daily.     pantoprazole (PROTONIX) 40 MG tablet Take 1 tablet (40 mg total) by mouth daily. 30 tablet 1   rosuvastatin (CRESTOR) 5 MG tablet Take 1 tablet (5 mg total) by mouth daily. 90 tablet 2   spironolactone (ALDACTONE) 25 MG tablet TAKE 1 TABLET(25 MG) BY MOUTH DAILY 90 tablet 1   thiamine 100 MG tablet Take 1 tablet (100 mg total) by mouth daily. 30 tablet 1   Vitamin D, Ergocalciferol,  (DRISDOL) 1.25 MG (50000 UNIT) CAPS capsule Take 1 capsule (50,000 Units total) by mouth every 7 (seven) days. Wednesdays; ONCE A WEEK NOT DAILY!!!     No facility-administered medications prior to visit.    Allergies  Allergen Reactions   Fluoride Preparations    Seasonal Ic [Cholestatin]        Objective:    BP 132/89 (BP Location: Right Arm, Patient Position: Sitting, Cuff Size: Normal)   Pulse 99   Temp 99.7 F (37.6 C) (Oral)   Resp 16  Ht 5\' 6"  (1.676 m)   Wt 127 lb (57.6 kg)   BMI 20.50 kg/m  Wt Readings from Last 3 Encounters:  07/01/20 127 lb (57.6 kg)  04/23/20 139 lb (63 kg)  03/27/20 139 lb 12.8 oz (63.4 kg)    Physical Exam Vitals and nursing note reviewed.  Constitutional:      Appearance: She is cachectic. She is not ill-appearing.  HENT:     Head: Normocephalic and atraumatic.  Cardiovascular:     Rate and Rhythm: Normal rate and regular rhythm.     Heart sounds: Normal heart sounds. No murmur heard.   No friction rub. No gallop.  Pulmonary:     Effort: Pulmonary effort is normal. No tachypnea or respiratory distress.     Breath sounds: Wheezing and rhonchi present. No decreased breath sounds or rales.  Chest:     Chest wall: No tenderness.  Abdominal:     General: Bowel sounds are normal.     Palpations: Abdomen is soft.  Musculoskeletal:        General: Normal range of motion.     Cervical back: Normal range of motion.  Skin:    General: Skin is warm and dry.     Findings: No erythema or rash.     Comments: Skin is very dry  Neurological:     Mental Status: She is alert and oriented to person, place, and time.     Coordination: Coordination normal.  Psychiatric:        Behavior: Behavior normal. Behavior is cooperative.        Thought Content: Thought content normal.        Judgment: Judgment normal.         Patient has been counseled extensively about nutrition and exercise as well as the importance of adherence with medications  and regular follow-up. The patient was given clear instructions to go to ER or return to medical center if symptoms don't improve, worsen or new problems develop. The patient verbalized understanding.   Follow-up: Return for PAP SMEAR.   Gildardo Pounds, FNP-BC [facility] and [facility], [place]   07/01/2020, 11:55 AM

## 2020-07-01 NOTE — Progress Notes (Signed)
Noticed swelling on both feet. Has subsided.  Itching back

## 2020-07-02 DIAGNOSIS — R531 Weakness: Secondary | ICD-10-CM | POA: Diagnosis not present

## 2020-07-02 LAB — BASIC METABOLIC PANEL
BUN/Creatinine Ratio: 14 (ref 12–28)
BUN: 9 mg/dL (ref 8–27)
CO2: 20 mmol/L (ref 20–29)
Calcium: 9.6 mg/dL (ref 8.7–10.3)
Chloride: 90 mmol/L — ABNORMAL LOW (ref 96–106)
Creatinine, Ser: 0.63 mg/dL (ref 0.57–1.00)
Glucose: 95 mg/dL (ref 65–99)
Potassium: 4.4 mmol/L (ref 3.5–5.2)
Sodium: 128 mmol/L — ABNORMAL LOW (ref 134–144)
eGFR: 100 mL/min/{1.73_m2} (ref >59)

## 2020-07-02 LAB — CBC WITH DIFFERENTIAL/PLATELET
Basophils Absolute: 0.1 10*3/uL (ref 0.0–0.2)
Basos: 1 %
EOS (ABSOLUTE): 0 10*3/uL (ref 0.0–0.4)
Eos: 1 %
Hematocrit: 39.9 % (ref 34.0–46.6)
Hemoglobin: 13.4 g/dL (ref 11.1–15.9)
Immature Grans (Abs): 0 10*3/uL (ref 0.0–0.1)
Immature Granulocytes: 0 %
Lymphocytes Absolute: 3 10*3/uL (ref 0.7–3.1)
Lymphs: 56 %
MCH: 29.4 pg (ref 26.6–33.0)
MCHC: 33.6 g/dL (ref 31.5–35.7)
MCV: 88 fL (ref 79–97)
Monocytes Absolute: 0.5 10*3/uL (ref 0.1–0.9)
Monocytes: 10 %
Neutrophils Absolute: 1.7 10*3/uL (ref 1.4–7.0)
Neutrophils: 32 %
Platelets: 248 10*3/uL (ref 150–450)
RBC: 4.56 x10E6/uL (ref 3.77–5.28)
RDW: 13.5 % (ref 11.7–15.4)
WBC: 5.3 10*3/uL (ref 3.4–10.8)

## 2020-07-02 LAB — HEMOGLOBIN A1C
Est. average glucose Bld gHb Est-mCnc: 128 mg/dL
Hgb A1c MFr Bld: 6.1 % — ABNORMAL HIGH (ref 4.8–5.6)

## 2020-07-02 LAB — CANCER ANTIGEN 19-9: CA 19-9: 30 U/mL (ref 0–35)

## 2020-07-02 LAB — CA 125: Cancer Antigen (CA) 125: 6 U/mL (ref 0.0–38.1)

## 2020-07-03 DIAGNOSIS — R531 Weakness: Secondary | ICD-10-CM | POA: Diagnosis not present

## 2020-07-04 DIAGNOSIS — R531 Weakness: Secondary | ICD-10-CM | POA: Diagnosis not present

## 2020-07-05 ENCOUNTER — Telehealth: Payer: Self-pay

## 2020-07-05 DIAGNOSIS — R531 Weakness: Secondary | ICD-10-CM | POA: Diagnosis not present

## 2020-07-05 NOTE — Telephone Encounter (Signed)
Patient name and DOB has been verified Patient was informed of lab results. Patient had no questions.  

## 2020-07-05 NOTE — Telephone Encounter (Signed)
-----   Message from Gildardo Pounds, NP sent at 07/02/2020  8:15 AM EDT ----- Chronically low Sodium and chloride levels. Please refrain from any alcohol use. Cancer tests are currently negative. CBC does not indicate any anemia or bleeding disorders. A1c still in prediabetes range

## 2020-07-08 DIAGNOSIS — R531 Weakness: Secondary | ICD-10-CM | POA: Diagnosis not present

## 2020-07-09 DIAGNOSIS — R531 Weakness: Secondary | ICD-10-CM | POA: Diagnosis not present

## 2020-07-10 DIAGNOSIS — R531 Weakness: Secondary | ICD-10-CM | POA: Diagnosis not present

## 2020-07-11 DIAGNOSIS — R531 Weakness: Secondary | ICD-10-CM | POA: Diagnosis not present

## 2020-07-19 DIAGNOSIS — R531 Weakness: Secondary | ICD-10-CM | POA: Diagnosis not present

## 2020-07-23 DIAGNOSIS — R531 Weakness: Secondary | ICD-10-CM | POA: Diagnosis not present

## 2020-07-24 DIAGNOSIS — R531 Weakness: Secondary | ICD-10-CM | POA: Diagnosis not present

## 2020-07-25 DIAGNOSIS — R531 Weakness: Secondary | ICD-10-CM | POA: Diagnosis not present

## 2020-07-26 DIAGNOSIS — R531 Weakness: Secondary | ICD-10-CM | POA: Diagnosis not present

## 2020-07-30 DIAGNOSIS — I10 Essential (primary) hypertension: Secondary | ICD-10-CM | POA: Diagnosis not present

## 2020-07-30 DIAGNOSIS — E785 Hyperlipidemia, unspecified: Secondary | ICD-10-CM | POA: Diagnosis not present

## 2020-07-30 DIAGNOSIS — G629 Polyneuropathy, unspecified: Secondary | ICD-10-CM | POA: Diagnosis not present

## 2020-07-30 DIAGNOSIS — J449 Chronic obstructive pulmonary disease, unspecified: Secondary | ICD-10-CM | POA: Diagnosis not present

## 2020-07-30 DIAGNOSIS — I2510 Atherosclerotic heart disease of native coronary artery without angina pectoris: Secondary | ICD-10-CM | POA: Diagnosis not present

## 2020-07-30 DIAGNOSIS — E039 Hypothyroidism, unspecified: Secondary | ICD-10-CM | POA: Diagnosis not present

## 2020-07-30 DIAGNOSIS — I951 Orthostatic hypotension: Secondary | ICD-10-CM | POA: Diagnosis not present

## 2020-07-30 DIAGNOSIS — R69 Illness, unspecified: Secondary | ICD-10-CM | POA: Diagnosis not present

## 2020-07-30 DIAGNOSIS — I739 Peripheral vascular disease, unspecified: Secondary | ICD-10-CM | POA: Diagnosis not present

## 2020-07-30 DIAGNOSIS — I69354 Hemiplegia and hemiparesis following cerebral infarction affecting left non-dominant side: Secondary | ICD-10-CM | POA: Diagnosis not present

## 2020-08-26 ENCOUNTER — Telehealth: Payer: Self-pay | Admitting: Nurse Practitioner

## 2020-08-26 NOTE — Telephone Encounter (Signed)
Called pt LVM due to provider being out virtual pap needs to be rescheduled left LMV to call us for this and also informed pt she can still come in tomorrow at the same time to get her vaccine as a nurse visit

## 2020-08-27 ENCOUNTER — Ambulatory Visit: Payer: Medicare HMO | Admitting: Nurse Practitioner

## 2020-08-27 ENCOUNTER — Ambulatory Visit: Payer: Medicare HMO | Attending: Nurse Practitioner

## 2020-08-27 ENCOUNTER — Other Ambulatory Visit: Payer: Self-pay

## 2020-08-27 DIAGNOSIS — Z23 Encounter for immunization: Secondary | ICD-10-CM | POA: Diagnosis not present

## 2020-08-27 NOTE — Progress Notes (Signed)
Pt here today for 2nd shingles shot Shingles vaccine given in  Pt tolerated shot well Pt has completed her shingles vaccine series.

## 2020-09-15 ENCOUNTER — Telehealth: Payer: Self-pay

## 2020-09-15 NOTE — Telephone Encounter (Signed)
Called pt so schedule awv, no answer left vm to call and schedule at [phone]

## 2020-09-22 ENCOUNTER — Telehealth: Payer: Self-pay

## 2020-09-22 NOTE — Telephone Encounter (Signed)
Called pt to schedule Annual Wellness Visit, No answer left message to call [facility] to schedule at [phone]  

## 2020-10-10 ENCOUNTER — Telehealth: Payer: Self-pay

## 2020-10-10 NOTE — Telephone Encounter (Signed)
Called pt to schedule Annual Wellness Visit, No answer left message to call [facility] to schedule at [phone]  

## 2020-10-17 DIAGNOSIS — R531 Weakness: Secondary | ICD-10-CM | POA: Diagnosis not present

## 2020-10-18 DIAGNOSIS — R531 Weakness: Secondary | ICD-10-CM | POA: Diagnosis not present

## 2020-10-21 DIAGNOSIS — R531 Weakness: Secondary | ICD-10-CM | POA: Diagnosis not present

## 2020-10-22 DIAGNOSIS — R531 Weakness: Secondary | ICD-10-CM | POA: Diagnosis not present

## 2020-10-23 DIAGNOSIS — R531 Weakness: Secondary | ICD-10-CM | POA: Diagnosis not present

## 2020-10-24 DIAGNOSIS — R531 Weakness: Secondary | ICD-10-CM | POA: Diagnosis not present

## 2020-10-28 DIAGNOSIS — R531 Weakness: Secondary | ICD-10-CM | POA: Diagnosis not present

## 2020-11-01 ENCOUNTER — Ambulatory Visit: Payer: Medicare HMO

## 2020-11-01 DIAGNOSIS — Z1211 Encounter for screening for malignant neoplasm of colon: Secondary | ICD-10-CM

## 2020-11-01 NOTE — Progress Notes (Signed)
Attempted to reach the patient x1 with a detailed message. Called again x 2 with phone going straight to voicemail.

## 2020-11-20 ENCOUNTER — Encounter: Payer: Medicare HMO | Admitting: Gastroenterology

## 2020-12-26 DIAGNOSIS — Z79899 Other long term (current) drug therapy: Secondary | ICD-10-CM | POA: Diagnosis not present

## 2020-12-26 DIAGNOSIS — Z743 Need for continuous supervision: Secondary | ICD-10-CM | POA: Diagnosis not present

## 2020-12-26 DIAGNOSIS — R0689 Other abnormalities of breathing: Secondary | ICD-10-CM | POA: Diagnosis not present

## 2020-12-26 DIAGNOSIS — R58 Hemorrhage, not elsewhere classified: Secondary | ICD-10-CM | POA: Diagnosis not present

## 2020-12-26 DIAGNOSIS — R040 Epistaxis: Secondary | ICD-10-CM | POA: Diagnosis not present

## 2020-12-26 DIAGNOSIS — Z87891 Personal history of nicotine dependence: Secondary | ICD-10-CM | POA: Diagnosis not present

## 2020-12-27 DIAGNOSIS — R040 Epistaxis: Secondary | ICD-10-CM | POA: Diagnosis not present

## 2021-05-29 ENCOUNTER — Encounter: Payer: Self-pay | Admitting: Gastroenterology

## 2022-11-09 IMAGING — MG MM DIGITAL SCREENING BILAT W/ TOMO AND CAD
8 series · 8 of 24 positions shown · non-contrast
Comparison: None.
COMPARISON: None.

Addendum:
CLINICAL DATA: Screening.

EXAM:
DIGITAL SCREENING BILATERAL MAMMOGRAM WITH TOMOSYNTHESIS AND CAD
TECHNIQUE: Bilateral screening digital craniocaudal and mediolateral oblique
mammograms were obtained. Bilateral screening digital breast
tomosynthesis was performed. The images were evaluated with
computer-aided detection.

[R CC synth-2D]
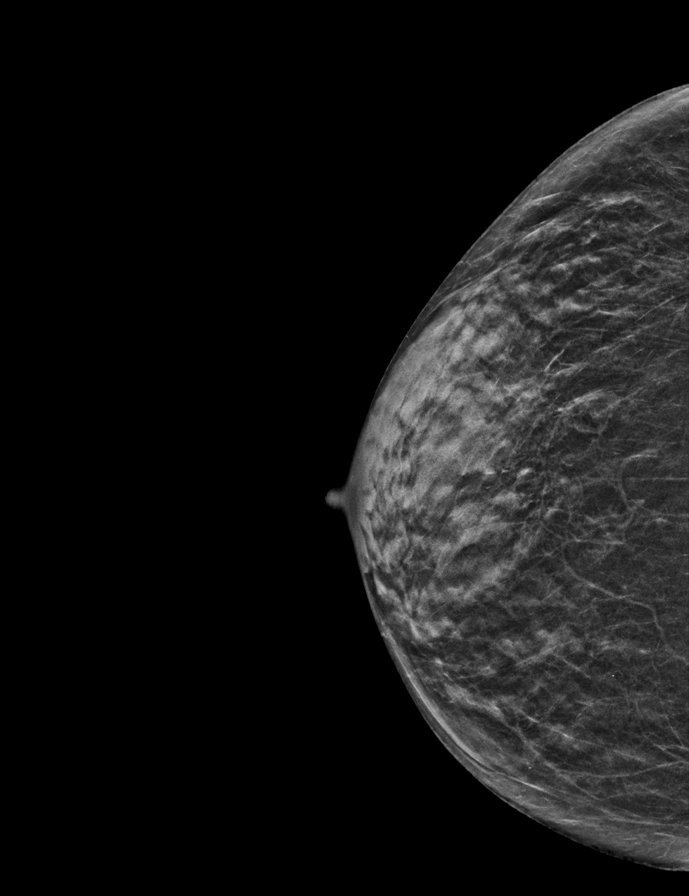

[L CC synth-2D]
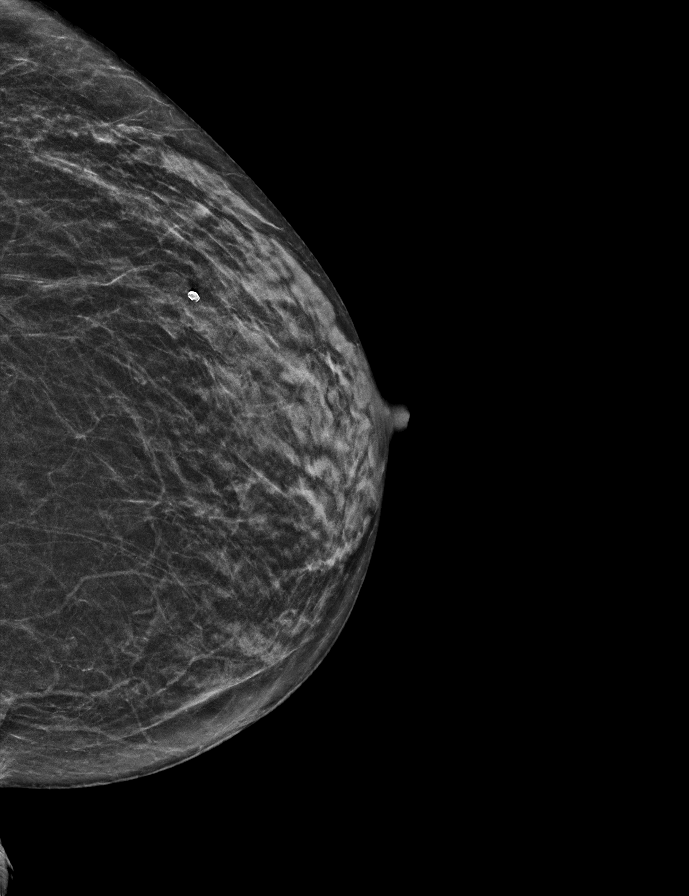

[R MLO synth-2D]
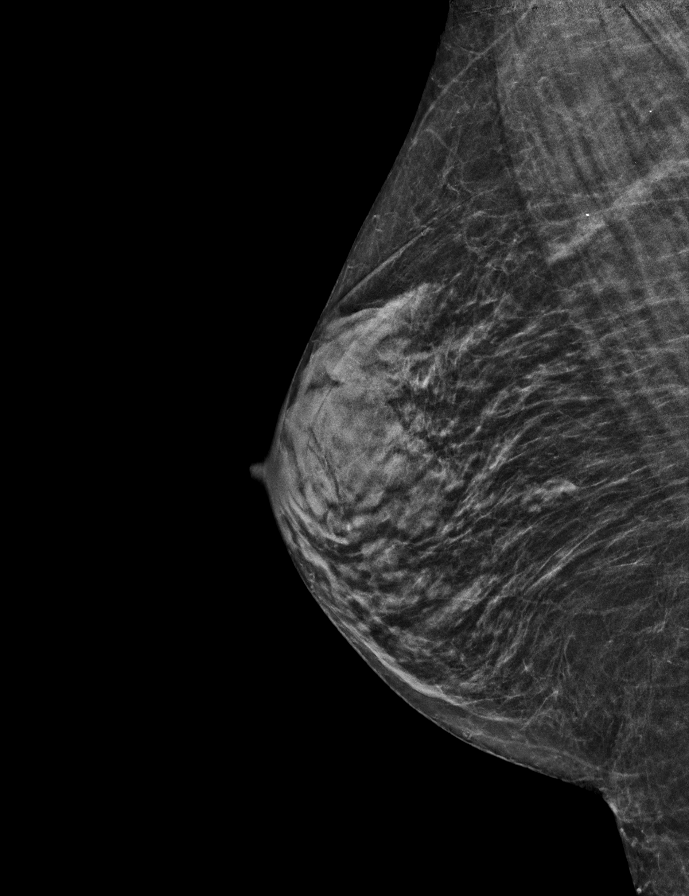

[L MLO synth-2D]
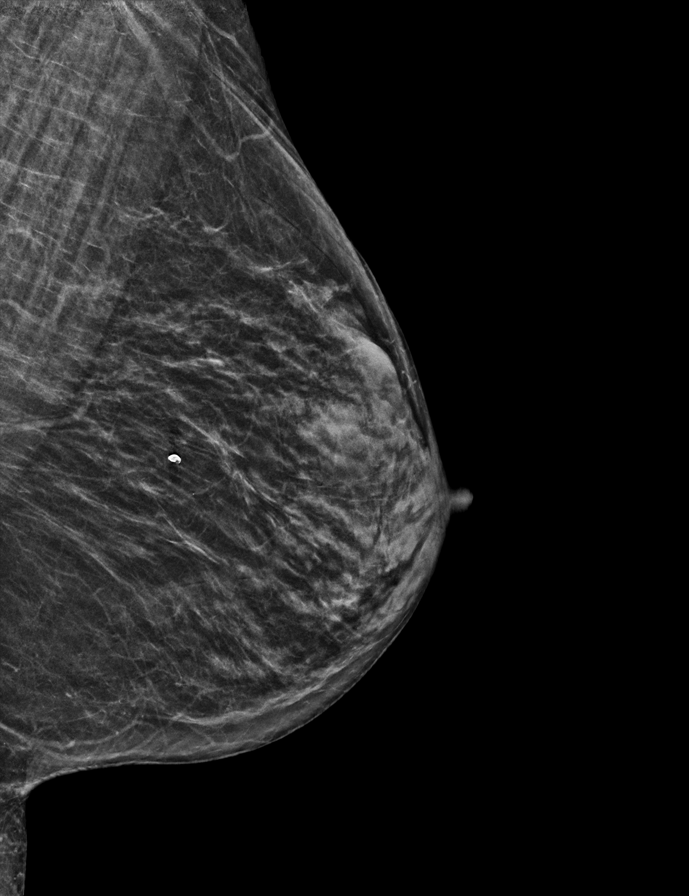

[L CC tomo · tomo slice 20/39.0]
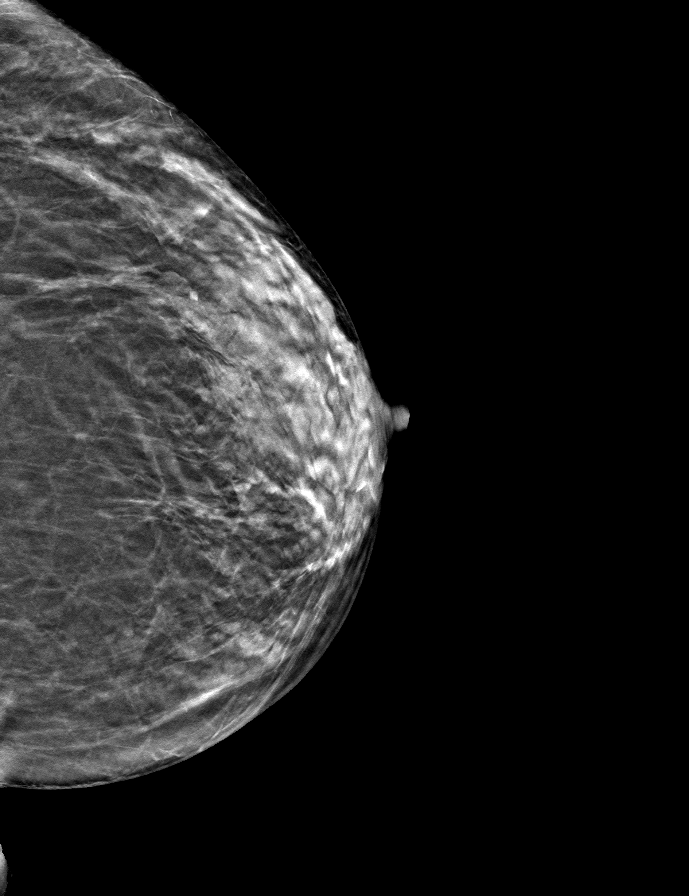

[L MLO tomo · tomo slice 21/40.0]
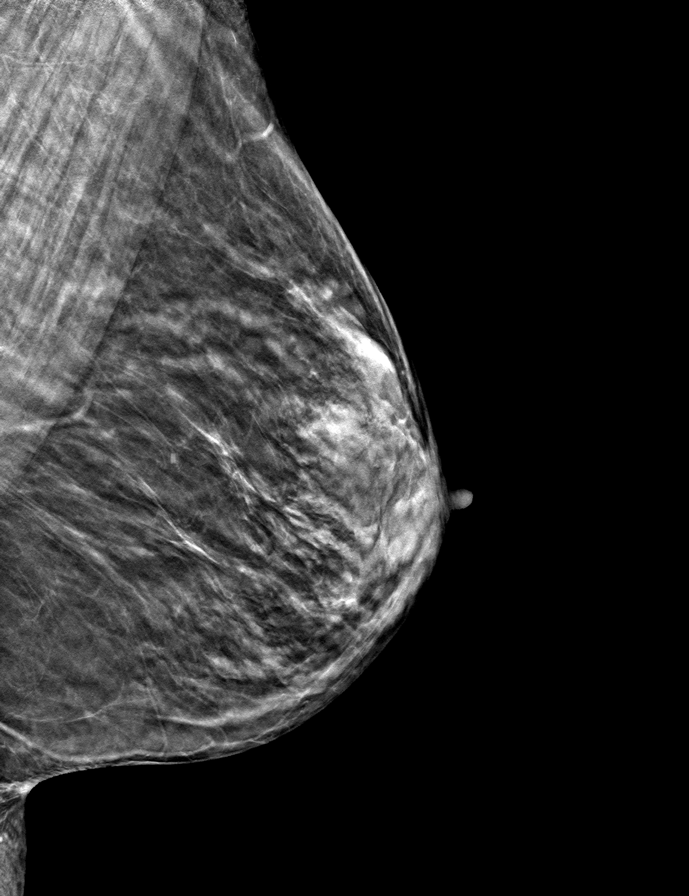

[R CC tomo · tomo slice 19/37.0]
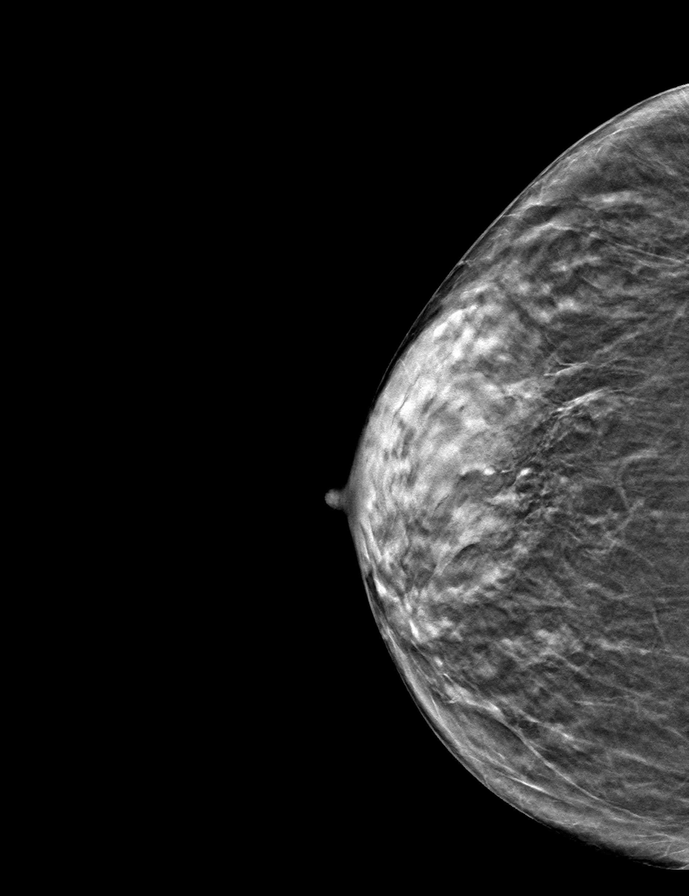

[R MLO tomo · tomo slice 20/39.0]
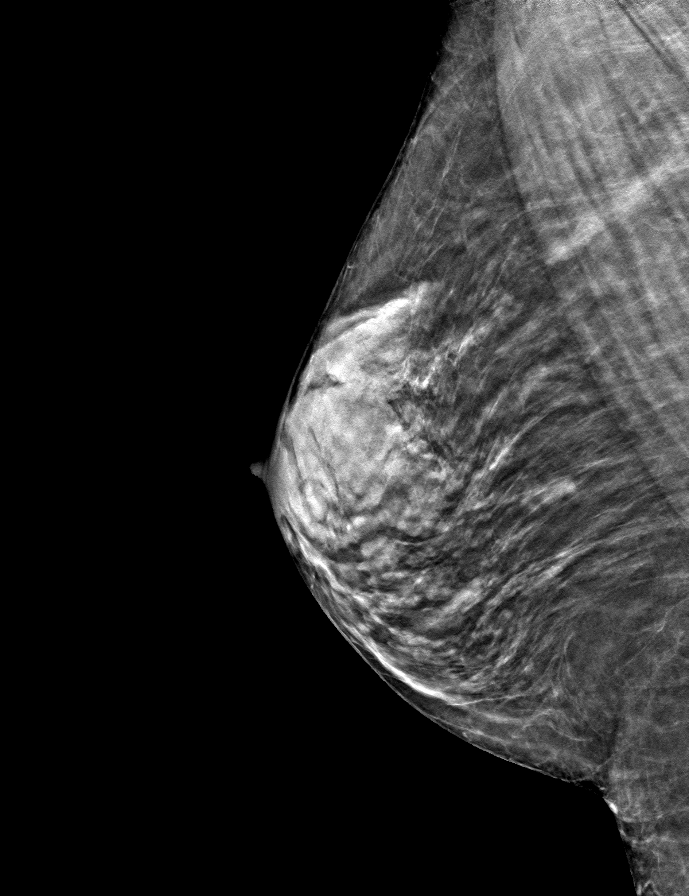

[8 of 24 positions shown; findings below may reference images not displayed]

ACR Breast Density Category d: The breast tissue is extremely dense,
which lowers the sensitivity of mammography.
FINDINGS: There are no findings suspicious for malignancy. The images were
evaluated with computer-aided detection.
IMPRESSION: No mammographic evidence of malignancy. A result letter of this
screening mammogram will be mailed directly to the patient.

RECOMMENDATION:
Screening mammogram in one year. (Code:14-N-SVD)

BI-RADS CATEGORY  1: Negative.

ADDENDUM:
Prior mammogram dated June 01, 2017 has become available for
comparison.

No significant changes are seen.

No change in the impression of the report.

*** End of Addendum ***
ACR Breast Density Category d: The breast tissue is extremely dense,
which lowers the sensitivity of mammography.
FINDINGS: There are no findings suspicious for malignancy. The images were
evaluated with computer-aided detection.
IMPRESSION: No mammographic evidence of malignancy. A result letter of this
screening mammogram will be mailed directly to the patient.

RECOMMENDATION:
Screening mammogram in one year. (Code:14-N-SVD)

BI-RADS CATEGORY  1: Negative.

## 2023-06-15 ENCOUNTER — Telehealth: Payer: Self-pay

## 2023-06-15 NOTE — Transitions of Care (Post Inpatient/ED Visit) (Signed)
   06/15/2023  Name: Jordan Bass MRN: [id number] DOB: 21-May-1955  Today's TOC FU Call Status: Today's TOC FU Call Status:: Unsuccessful Call (1st Attempt) Unsuccessful Call (1st Attempt) Date: 06/15/23  Attempted to reach the patient regarding the most recent Inpatient/ED visit.  Follow Up Plan: Additional outreach attempts will be made to reach the patient to complete the Transitions of Care (Post Inpatient/ED visit) call.   Tonia Frankel RN, CCM [place]  VBCI-Population Health RN Care Manager [phone]

## 2023-06-16 ENCOUNTER — Telehealth: Payer: Self-pay

## 2023-06-16 NOTE — Transitions of Care (Post Inpatient/ED Visit) (Signed)
   06/16/2023  Name: Jordan Bass MRN: [id number] DOB: 1955-03-15  Today's TOC FU Call Status: Today's TOC FU Call Status:: Unsuccessful Call (2nd Attempt) Unsuccessful Call (2nd Attempt) Date: 06/16/23  Attempted to reach the patient regarding the most recent Inpatient/ED visit.  Follow Up Plan: Additional outreach attempts will be made to reach the patient to complete the Transitions of Care (Post Inpatient/ED visit) call.   Tonia Frankel RN, CCM [place]  VBCI-Population Health RN Care Manager [phone]

## 2023-06-17 ENCOUNTER — Telehealth: Payer: Self-pay

## 2023-06-17 NOTE — Transitions of Care (Post Inpatient/ED Visit) (Signed)
   06/17/2023  Name: JON KASPAREK MRN: [id number] DOB: 1955/07/07  Today's TOC FU Call Status: Today's TOC FU Call Status:: Unsuccessful Call (3rd Attempt) Unsuccessful Call (3rd Attempt) Date: 06/17/23  Attempted to reach the patient regarding the most recent Inpatient/ED visit.  Follow Up Plan: No further outreach attempts will be made at this time. We have been unable to contact the patient.  Tonia Frankel RN, CCM [place]  VBCI-Population Health RN Care Manager [phone]
